# Patient Record
Sex: Female | Born: 1985 | Race: White | Hispanic: No | Marital: Single | Smoking: Former smoker
Health system: Southern US, Community
[De-identification: ages and names within clinical notes are randomized; demographics above are authoritative.]

## PROBLEM LIST (undated history)

## (undated) DIAGNOSIS — I471 Supraventricular tachycardia, unspecified: Secondary | ICD-10-CM

## (undated) DIAGNOSIS — F53 Postpartum depression: Secondary | ICD-10-CM

## (undated) DIAGNOSIS — O99345 Other mental disorders complicating the puerperium: Secondary | ICD-10-CM

## (undated) DIAGNOSIS — R87629 Unspecified abnormal cytological findings in specimens from vagina: Secondary | ICD-10-CM

## (undated) DIAGNOSIS — F410 Panic disorder [episodic paroxysmal anxiety] without agoraphobia: Secondary | ICD-10-CM

## (undated) DIAGNOSIS — Z8679 Personal history of other diseases of the circulatory system: Secondary | ICD-10-CM

## (undated) HISTORY — DX: Supraventricular tachycardia, unspecified: I47.10

## (undated) HISTORY — DX: Panic disorder (episodic paroxysmal anxiety): F41.0

## (undated) HISTORY — DX: Postpartum depression: F53.0

## (undated) HISTORY — DX: Supraventricular tachycardia: I47.1

## (undated) HISTORY — PX: CARDIAC ELECTROPHYSIOLOGY STUDY AND ABLATION: SHX1294

## (undated) HISTORY — PX: WISDOM TOOTH EXTRACTION: SHX21

## (undated) HISTORY — DX: Unspecified abnormal cytological findings in specimens from vagina: R87.629

## (undated) HISTORY — DX: Other mental disorders complicating the puerperium: O99.345

## (undated) HISTORY — DX: Personal history of other diseases of the circulatory system: Z86.79

## (undated) HISTORY — PX: COLPOSCOPY: SHX161

## (undated) HISTORY — PX: INDUCED ABORTION: SHX677

---

## 2012-09-04 HISTORY — PX: LEEP: SHX91

## 2017-11-27 NOTE — Progress Notes (Signed)
Subjective:    Patient ID: Catherine Carpenter, female    DOB: [birth year], 32 y.o.   MRN: [id number]  HPI Chief Complaint  Patient presents with  . new pt    new pt cpe, no other concerns. sees obgyn. possible chance of pregnancy-   She is new to the practice and here for a complete physical exam. Previous medical care: moved here from [facility].    History of panic disorder and states she has been taking clonazepam for several years.  States she does not take it every day. Reports recently going through a divorce and moving here has caused her increased stress and she has been taking it more than usual.  History of SVT and rheumatic fever.  States she has had 2 ablations in the past. States she is not followed up with her cardiologist since 2017.   Other providers: OB/GYN -she plans to schedule with one here.  Social history: Lives with her dad. She is divorced and child is 59 years old, works for [employer]  She stopped smoking for 5 years until going through a divorce and started back last year.  Smoking 1/2 pack per day. Since age 24.  Diet: fairly healthy  Excerise: walks daily   Immunizations: up to date [facility]   Depression screen [facility] 2/9 11/28/2017  Decreased Interest 0  Down, Depressed, Hopeless 0  PHQ - 2 Score 0     Health maintenance:  Mammogram: N/A Colonoscopy: N/A Last Gynecological Exam: last pap smear 2017 and normal. History of ASCUS high risk.   Last Dental Exam: twice annually  Last Eye Exam: 1 year ago   Wears seatbelt always, uses sunscreen, smoke detectors in home and functioning, does not text while driving and feels safe in home environment.   Reviewed allergies, medications, past medical, surgical, family, and social history.    Review of Systems Review of Systems Constitutional: -fever, -chills, -sweats, -unexpected weight change,-fatigue ENT: -runny nose, -ear pain, -sore throat Cardiology:  -chest pain, -palpitations, -edema Respiratory:  -cough, -shortness of breath, -wheezing Gastroenterology: -abdominal pain, -nausea, -vomiting, -diarrhea, -constipation  Hematology: -bleeding or bruising problems Musculoskeletal: -arthralgias, -myalgias, -joint swelling, -back pain Ophthalmology: -vision changes Urology: -dysuria, -difficulty urinating, -hematuria, -urinary frequency, -urgency Neurology: -headache, -weakness, -tingling, -numbness       Objective:   Physical Exam BP 120/64   Pulse 71   Ht 5' 7.25" (1.708 m)   Wt 139 lb 6.4 oz (63.2 kg)   LMP 11/03/2017   BMI 21.67 kg/m   General Appearance:    Alert, cooperative, no distress, appears stated age  Head:    Normocephalic, without obvious abnormality, atraumatic  Eyes:    PERRL, conjunctiva/corneas clear, EOM's intact, fundi    benign  Ears:    Normal TM's and external ear canals  Nose:   Nares normal, mucosa normal, no drainage or sinus   tenderness  Throat:   Lips, mucosa, and tongue normal; teeth and gums normal  Neck:   Supple, no lymphadenopathy;  thyroid:  no   enlargement/tenderness/nodules; no carotid   bruit or JVD  Back:    Spine nontender, no curvature, ROM normal, no CVA     tenderness  Lungs:     Clear to auscultation bilaterally without wheezes, rales or     ronchi; respirations unlabored  Chest Wall:    No tenderness or deformity   Heart:    Regular rate and rhythm, S1 and S2 normal, no murmur, rub   or  gallop  Breast Exam:    OB/GYN  Abdomen:     Soft, non-tender, nondistended, normoactive bowel sounds,    no masses, no hepatosplenomegaly  Genitalia:    OB/GYN  Rectal:    Not performed due to age<40 and no related complaints  Extremities:   No clubbing, cyanosis or edema  Pulses:   2+ and symmetric all extremities  Skin:   Skin color, texture, turgor normal, no rashes or lesions  Lymph nodes:   Cervical, supraclavicular, and axillary nodes normal  Neurologic:   CNII-XII intact, normal strength, sensation and gait; reflexes 2+ and symmetric  throughout          Psych:   Normal mood, affect, hygiene and grooming.     Urinalysis dipstick: negative  UPT- negative        Assessment & Plan:  Routine general medical examination at a health care facility - Plan: POCT Urinalysis DIP (Proadvantage Device), CBC with Differential/Platelet, Comprehensive metabolic panel, TSH  Unprotected sex - Plan: POCT urine pregnancy  Paroxysmal SVT (supraventricular tachycardia) (HCC)  Panic disorder - Plan: clonazePAM (KLONOPIN) 1 MG tablet  History of rheumatic fever as a child  Family history of thyroid disease in mother - Plan: TSH  UPT negative.  Urinalysis dipstick negative. Declines STD testing. Plans to schedule with OB/GYN for Pap smear. Discussed that clonazepam is recommended for short-term treatment.  She reports trying multiple medications in the past and did not like how they made her feel.  She declines seeing a Veterinary surgeoncounselor. Discussed that I will refill clonazepam for now and if she is needing it on a daily basis then I will refer her to psychiatry for further evaluation.  I also encouraged her to think about seeing a counselor.  Discussed potential side effects from long-term benzodiazepine use. Follow up pending labs. Will consider referral to cardiology since she reports needing to follow up.

## 2017-11-28 ENCOUNTER — Encounter: Payer: Self-pay | Admitting: Family Medicine

## 2017-11-28 ENCOUNTER — Ambulatory Visit (INDEPENDENT_AMBULATORY_CARE_PROVIDER_SITE_OTHER): Payer: 59 | Admitting: Family Medicine

## 2017-11-28 VITALS — BP 120/64 | HR 71 | Ht 67.25 in | Wt 139.4 lb

## 2017-11-28 DIAGNOSIS — Z8349 Family history of other endocrine, nutritional and metabolic diseases: Secondary | ICD-10-CM | POA: Diagnosis not present

## 2017-11-28 DIAGNOSIS — Z7251 High risk heterosexual behavior: Secondary | ICD-10-CM

## 2017-11-28 DIAGNOSIS — Z8679 Personal history of other diseases of the circulatory system: Secondary | ICD-10-CM | POA: Diagnosis not present

## 2017-11-28 DIAGNOSIS — I471 Supraventricular tachycardia: Secondary | ICD-10-CM | POA: Diagnosis not present

## 2017-11-28 DIAGNOSIS — F410 Panic disorder [episodic paroxysmal anxiety] without agoraphobia: Secondary | ICD-10-CM | POA: Insufficient documentation

## 2017-11-28 DIAGNOSIS — Z0000 Encounter for general adult medical examination without abnormal findings: Secondary | ICD-10-CM | POA: Diagnosis not present

## 2017-11-28 LAB — POCT URINALYSIS DIP (PROADVANTAGE DEVICE)
Bilirubin, UA: NEGATIVE
Blood, UA: NEGATIVE
Glucose, UA: NEGATIVE mg/dL
Ketones, POC UA: NEGATIVE mg/dL
LEUKOCYTES UA: NEGATIVE
NITRITE UA: NEGATIVE
PH UA: 6 (ref 5.0–8.0)
Protein Ur, POC: NEGATIVE mg/dL
Specific Gravity, Urine: 1.03
Urobilinogen, Ur: NEGATIVE

## 2017-11-28 LAB — POCT URINE PREGNANCY: PREG TEST UR: NEGATIVE

## 2017-11-28 MED ORDER — CLONAZEPAM 1 MG PO TABS: 1.0000 mg | ORAL_TABLET | Freq: Two times a day (BID) | ORAL | 0 refills | Status: DC | PRN

## 2017-11-28 MED FILL — clonazePAM 1 MG TABS: 1 | 30 days supply | Qty: 60 | Fill #0

## 2017-11-28 NOTE — Patient Instructions (Addendum)

## 2017-11-29 LAB — COMPREHENSIVE METABOLIC PANEL
A/G RATIO: 1.7 (ref 1.2–2.2)
ALT: 13 IU/L (ref 0–32)
AST: 14 IU/L (ref 0–40)
Albumin: 4.7 g/dL (ref 3.5–5.5)
Alkaline Phosphatase: 55 IU/L (ref 39–117)
BUN/Creatinine Ratio: 14 (ref 9–23)
BUN: 10 mg/dL (ref 6–20)
Bilirubin Total: <0.2 mg/dL (ref 0.0–1.2)
CALCIUM: 9.8 mg/dL (ref 8.7–10.2)
CO2: 24 mmol/L (ref 20–29)
Chloride: 100 mmol/L (ref 96–106)
Creatinine, Ser: 0.7 mg/dL (ref 0.57–1.00)
GFR calc Af Amer: 135 mL/min/{1.73_m2} (ref >59)
GFR, EST NON AFRICAN AMERICAN: 117 mL/min/{1.73_m2} (ref >59)
Globulin, Total: 2.8 g/dL (ref 1.5–4.5)
Glucose: 82 mg/dL (ref 65–99)
POTASSIUM: 4.3 mmol/L (ref 3.5–5.2)
Sodium: 138 mmol/L (ref 134–144)
Total Protein: 7.5 g/dL (ref 6.0–8.5)

## 2017-11-29 LAB — CBC WITH DIFFERENTIAL/PLATELET
BASOS ABS: 0 10*3/uL (ref 0.0–0.2)
Basos: 0 %
EOS (ABSOLUTE): 0.1 10*3/uL (ref 0.0–0.4)
Eos: 1 %
Hematocrit: 40.4 % (ref 34.0–46.6)
Hemoglobin: 13.2 g/dL (ref 11.1–15.9)
IMMATURE GRANULOCYTES: 0 %
Immature Grans (Abs): 0 10*3/uL (ref 0.0–0.1)
Lymphocytes Absolute: 3.8 10*3/uL — ABNORMAL HIGH (ref 0.7–3.1)
Lymphs: 39 %
MCH: 31.8 pg (ref 26.6–33.0)
MCHC: 32.7 g/dL (ref 31.5–35.7)
MCV: 97 fL (ref 79–97)
MONOS ABS: 0.6 10*3/uL (ref 0.1–0.9)
Monocytes: 7 %
NEUTROS PCT: 53 %
Neutrophils Absolute: 5 10*3/uL (ref 1.4–7.0)
PLATELETS: 301 10*3/uL (ref 150–379)
RBC: 4.15 x10E6/uL (ref 3.77–5.28)
RDW: 12.6 % (ref 12.3–15.4)
WBC: 9.6 10*3/uL (ref 3.4–10.8)

## 2017-11-29 LAB — TSH: TSH: 0.775 u[IU]/mL (ref 0.450–4.500)

## 2017-12-19 ENCOUNTER — Telehealth: Payer: 59 | Admitting: Family

## 2017-12-19 ENCOUNTER — Telehealth: Payer: Self-pay

## 2017-12-19 DIAGNOSIS — B3731 Acute candidiasis of vulva and vagina: Secondary | ICD-10-CM

## 2017-12-19 DIAGNOSIS — B373 Candidiasis of vulva and vagina: Secondary | ICD-10-CM

## 2017-12-19 MED ORDER — FLUCONAZOLE 150 MG PO TABS: 150.0000 mg | ORAL_TABLET | Freq: Once | ORAL | 0 refills | Status: AC

## 2017-12-19 MED FILL — FLUCONAZOLE 150 MG TABLET: 150 | 1 days supply | Qty: 1 | Fill #0

## 2017-12-19 NOTE — Telephone Encounter (Signed)
  Patient called back again to check on rx States she was told would be handled before 11:30 Advised her that is not normally possible and that lunch is usually the earliest providers are able to look at their messages.  She states that she is leaving to go oout of town at [facility] tomorrow morning

## 2017-12-19 NOTE — Telephone Encounter (Signed)
She would need to be seen to determine appropriate treatment.

## 2017-12-19 NOTE — Progress Notes (Signed)

## 2017-12-19 NOTE — Telephone Encounter (Signed)
Patient called stating that she has either a yeast infection or bacterial infection and will need an antibiotic sent to the pharmacy. Stated she can't come in for an appointment today and she's going out of town tomorrow. Please advise patient.   She wants to have the med called in before 11:30 if possible.    Symptoms: Thick white discharge with odor and minor itch.   Patient asked to leave a message if she does not answer letting her know her script has been sent.

## 2017-12-19 NOTE — Telephone Encounter (Signed)
Patient was contacted and notified. Patient states she will contact a [facility] doctor via mychart in hopes to get medication.

## 2018-01-01 ENCOUNTER — Telehealth: Payer: 59 | Admitting: Family

## 2018-01-01 ENCOUNTER — Telehealth (INDEPENDENT_AMBULATORY_CARE_PROVIDER_SITE_OTHER): Payer: Self-pay

## 2018-01-01 DIAGNOSIS — N760 Acute vaginitis: Secondary | ICD-10-CM | POA: Diagnosis not present

## 2018-01-01 DIAGNOSIS — B9689 Other specified bacterial agents as the cause of diseases classified elsewhere: Secondary | ICD-10-CM | POA: Diagnosis not present

## 2018-01-01 MED ORDER — METRONIDAZOLE 500 MG PO TABS: 500.0000 mg | ORAL_TABLET | Freq: Two times a day (BID) | ORAL | 0 refills | Status: DC

## 2018-01-01 MED ORDER — FLUCONAZOLE 150 MG PO TABS: 150.0000 mg | ORAL_TABLET | Freq: Once | ORAL | 0 refills | Status: AC

## 2018-01-01 MED FILL — FLUCONAZOLE 150 MG TABS: 150 | 1 days supply | Qty: 1 | Fill #0

## 2018-01-01 MED FILL — metroNIDAZOLE 500 MG TABS: 500 | 7 days supply | Qty: 14 | Fill #0

## 2018-01-01 NOTE — Progress Notes (Signed)

## 2018-01-04 ENCOUNTER — Other Ambulatory Visit: Payer: Self-pay | Admitting: Family Medicine

## 2018-01-04 DIAGNOSIS — F410 Panic disorder [episodic paroxysmal anxiety] without agoraphobia: Secondary | ICD-10-CM

## 2018-01-04 MED FILL — clonazePAM 1 MG TABS: 1 | 30 days supply | Qty: 60 | Fill #0

## 2018-01-04 NOTE — Telephone Encounter (Signed)
Sent My [facility] message to pt about info that you gave me . KH 01-04-18

## 2018-01-04 NOTE — Telephone Encounter (Signed)
This was taken care of by Vernona Rieger earlier today. I approved it. Have her check the pharmacy.

## 2018-01-04 NOTE — Telephone Encounter (Signed)
Please advise if Klonopin can be  filled at [facility].

## 2018-01-04 NOTE — Telephone Encounter (Signed)
Please give her my list of psychiatrists and she will want to call and check to make sure they take her insurance.

## 2018-01-04 NOTE — Telephone Encounter (Signed)
Called in Clonazepam per Vickie & called pt informed.  Also advised pt per Vickie that if she is going to need this on a regular basis that she would need to be seen by psychiatrist.   Pt would like to know who you recommend that is in the [facility] network?

## 2018-01-04 NOTE — Telephone Encounter (Signed)
Pt wants a call back when RX has been approved.  Pharmacy closes at 6 and she is coming from [place].

## 2018-02-01 DIAGNOSIS — F419 Anxiety disorder, unspecified: Secondary | ICD-10-CM | POA: Diagnosis not present

## 2018-02-27 ENCOUNTER — Encounter: Payer: Self-pay | Admitting: Family Medicine

## 2018-02-27 ENCOUNTER — Other Ambulatory Visit: Payer: Self-pay | Admitting: Family Medicine

## 2018-02-27 DIAGNOSIS — F410 Panic disorder [episodic paroxysmal anxiety] without agoraphobia: Secondary | ICD-10-CM

## 2018-02-27 MED ORDER — CLONAZEPAM 1 MG PO TABS: ORAL_TABLET | ORAL | 0 refills | Status: DC

## 2018-02-27 MED FILL — clonazePAM 1 MG TABS: 1 | 30 days supply | Qty: 60 | Fill #0

## 2018-03-08 DIAGNOSIS — I00 Rheumatic fever without heart involvement: Secondary | ICD-10-CM | POA: Insufficient documentation

## 2018-03-08 DIAGNOSIS — F410 Panic disorder [episodic paroxysmal anxiety] without agoraphobia: Secondary | ICD-10-CM | POA: Diagnosis not present

## 2018-03-08 DIAGNOSIS — Z8679 Personal history of other diseases of the circulatory system: Secondary | ICD-10-CM | POA: Diagnosis not present

## 2018-03-08 DIAGNOSIS — I471 Supraventricular tachycardia: Secondary | ICD-10-CM | POA: Diagnosis not present

## 2018-05-24 ENCOUNTER — Encounter: Payer: Self-pay | Admitting: Family Medicine

## 2018-06-05 ENCOUNTER — Encounter (HOSPITAL_COMMUNITY): Payer: Self-pay

## 2018-06-05 ENCOUNTER — Ambulatory Visit (INDEPENDENT_AMBULATORY_CARE_PROVIDER_SITE_OTHER): Payer: 59

## 2018-06-05 ENCOUNTER — Ambulatory Visit (HOSPITAL_COMMUNITY)
Admission: EM | Admit: 2018-06-05 | Discharge: 2018-06-05 | Disposition: A | Discharge status: Home / Self Care | Payer: 59 | Attending: Family Medicine | Admitting: Family Medicine

## 2018-06-05 DIAGNOSIS — M25572 Pain in left ankle and joints of left foot: Secondary | ICD-10-CM

## 2018-06-05 DIAGNOSIS — M79672 Pain in left foot: Secondary | ICD-10-CM

## 2018-06-05 DIAGNOSIS — S99912A Unspecified injury of left ankle, initial encounter: Secondary | ICD-10-CM | POA: Diagnosis not present

## 2018-06-05 DIAGNOSIS — S93402A Sprain of unspecified ligament of left ankle, initial encounter: Secondary | ICD-10-CM | POA: Diagnosis not present

## 2018-06-05 DIAGNOSIS — M7989 Other specified soft tissue disorders: Secondary | ICD-10-CM | POA: Diagnosis not present

## 2018-06-05 DIAGNOSIS — S93602A Unspecified sprain of left foot, initial encounter: Secondary | ICD-10-CM | POA: Diagnosis not present

## 2018-06-05 MED ORDER — TRAMADOL HCL 50 MG PO TABS: 50.0000 mg | ORAL_TABLET | Freq: Four times a day (QID) | ORAL | 0 refills | Status: DC | PRN

## 2018-06-05 MED FILL — traMADol HCL 50 MG TABS: 50 | 4 days supply | Qty: 15 | Fill #0

## 2018-06-05 NOTE — ED Notes (Signed)
Bed: UC01 Expected date:  Expected time:  Means of arrival:  Comments: Appointments 

## 2018-06-05 NOTE — ED Provider Notes (Signed)
[facility]   [id number] 06/05/18 Arrival Time: 1552  ASSESSMENT & PLAN:  1. Acute left ankle pain   2. Foot pain, left   3. Sprain of left ankle, unspecified ligament, initial encounter     Imaging: Dg Ankle Complete Left  Result Date: 06/05/2018 CLINICAL DATA:  Injury to ankle 1 week ago.  Unable bear weight. EXAM: LEFT ANKLE COMPLETE - 3+ VIEW COMPARISON:  None. FINDINGS: Soft tissue swelling is present anterior and lateral to the left ankle. There is no underlying fracture. The ankle joint is located. IMPRESSION: 1. Soft tissue swelling anterior and lateral to the left ankle without underlying fractures. Electronically Signed   By: Marin Roberts M.D.   On: 06/05/2018 17:26   Dg Foot Complete Left  Result Date: 06/05/2018 CLINICAL DATA:  Twisted foot while running to car. EXAM: LEFT FOOT - COMPLETE 3+ VIEW COMPARISON:  None. FINDINGS: There is no evidence of fracture or dislocation. There is no evidence of arthropathy or other focal bone abnormality. Soft tissues are unremarkable. IMPRESSION: Negative. Electronically Signed   By: Awilda Metro M.D.   On: 06/05/2018 17:17   Meds ordered this encounter  Medications  . traMADol (ULTRAM) 50 MG tablet    Sig: Take 1 tablet (50 mg total) by mouth every 6 (six) hours as needed.    Dispense:  15 tablet    Refill:  0    Follow-up Information    Henson, Vickie L, NP-C.   Specialty:  Family Medicine Why:  As needed. Contact information: [address] [address] [phone]          She has ASO to use. WBAT. Declines crutches. OTC ibuprofen with food. Medication sedation precautions given.  Reviewed expectations re: course of current medical issues. Questions answered. Outlined signs and symptoms indicating need for more acute intervention. Patient verbalized understanding. After Visit Summary given.  SUBJECTIVE: History from: patient. Catherine Carpenter is a 32 y.o. female who reports  persistent mild to moderate pain of her left ankle and foot that has gradually worsened since beginning; described as aching without radiation. Onset: abrupt, 4-5 days ago. Injury/trama: yes, reports fall with immediate discomfort; able to bear weight but with discomfort. Relieved by: rest. Worsened by: weight bearing and certain movements. Associated symptoms: none reported. Extremity sensation changes or weakness: none. Self treatment: tried OTCs without relief of pain. History of similar: no  ROS: As per HPI.   OBJECTIVE:  Vitals:   06/05/18 1634  BP: 113/80  Pulse: 78  Resp: 20  Temp: 98.8 F (37.1 C)  TempSrc: Oral  SpO2: 100%    General appearance: alert; no distress Extremities: warm and well perfused; symmetrical with no gross deformities; localized tenderness over her left lateral ankle and foot, lesser so, with mild swelling and mild bruising near toes; ROM around area or areas of discomfort: normal but with discomfort CV: brisk extremity capillary refill Skin: warm and dry Neurologic: normal gait; normal symmetric reflexes in all extremities; normal sensation in all extremities Psychological: alert and cooperative; normal mood and affect  Allergies  Allergen Reactions  . Penicillins Hives    Past Medical History:  Diagnosis Date  . History of rheumatic fever as a child   . Panic disorder   . Paroxysmal SVT (supraventricular tachycardia) (HCC)    Social History   Socioeconomic History  . Marital status: Single    Spouse name: Not on file  . Number of children: Not on file  . Years of  education: Not on file  . Highest education level: Not on file  Occupational History  . Not on file  Social Needs  . Financial resource strain: Not on file  . Food insecurity:    Worry: Not on file    Inability: Not on file  . Transportation needs:    Medical: Not on file    Non-medical: Not on file  Tobacco Use  . Smoking status: Current Every Day Smoker     Packs/day: 0.50  . Smokeless tobacco: Never Used  Substance and Sexual Activity  . Alcohol use: Yes    Comment: socially  . Drug use: Never  . Sexual activity: Yes    Partners: Male    Birth control/protection: None  Lifestyle  . Physical activity:    Days per week: Not on file    Minutes per session: Not on file  . Stress: Not on file  Relationships  . Social connections:    Talks on phone: Not on file    Gets together: Not on file    Attends religious service: Not on file    Active member of club or organization: Not on file    Attends meetings of clubs or organizations: Not on file    Relationship status: Not on file  Other Topics Concern  . Not on file  Social History Narrative  . Not on file   Family History  Problem Relation Age of Onset  . Diabetes Mother   . Thyroid disease Mother   . Depression Mother   . Depression Father   . Hypertension Father   . Hyperlipidemia Father    Past Surgical History:  Procedure Laterality Date  . CARDIAC ELECTROPHYSIOLOGY STUDY AND ABLATION    . INDUCED ABORTION    . LEEP  2014      Mardella Layman, MD 06/19/18 [phone]

## 2018-06-05 NOTE — ED Triage Notes (Signed)
Pt presents with left ankle and foot pain following a fall Thursday night

## 2018-06-05 NOTE — Discharge Instructions (Addendum)
You have been diagnosed with an ankle sprain today. Please wear the ankle brace provided for the next week. If crutches were provided, please use them to remain non weight bearing for the next 2 days. After this, you may gradually begin to bear weight as tolerated. If possible, elevate your ankle when seated. Applying ice for 20 minutes at a time every hour as needed may help with any pain for swelling you may have. You may also  take Ibuprofen 3 times daily for pain and inflammation. Follow up with your doctor or an orthopaedist in 1 week if you are not seeing significant improvement. °

## 2018-06-17 MED FILL — clonazePAM 1 MG TABS: 1 | 30 days supply | Qty: 60 | Fill #0

## 2018-06-28 DIAGNOSIS — F410 Panic disorder [episodic paroxysmal anxiety] without agoraphobia: Secondary | ICD-10-CM | POA: Diagnosis not present

## 2018-06-28 DIAGNOSIS — Z01419 Encounter for gynecological examination (general) (routine) without abnormal findings: Secondary | ICD-10-CM | POA: Diagnosis not present

## 2018-07-03 LAB — HM PAP SMEAR: HM Pap smear: NEGATIVE

## 2018-07-10 ENCOUNTER — Other Ambulatory Visit: Payer: 59 | Admitting: Adult Health

## 2018-07-12 ENCOUNTER — Telehealth: Payer: 59 | Admitting: Family

## 2018-07-12 DIAGNOSIS — N390 Urinary tract infection, site not specified: Secondary | ICD-10-CM

## 2018-07-12 MED ORDER — NITROFURANTOIN MONOHYD MACRO 100 MG PO CAPS: 100.0000 mg | ORAL_CAPSULE | Freq: Two times a day (BID) | ORAL | 0 refills | Status: DC

## 2018-07-12 NOTE — Progress Notes (Signed)

## 2018-07-16 ENCOUNTER — Telehealth: Payer: 59 | Admitting: Nurse Practitioner

## 2018-07-16 ENCOUNTER — Encounter: Payer: Self-pay | Admitting: Family Medicine

## 2018-07-16 DIAGNOSIS — B373 Candidiasis of vulva and vagina: Secondary | ICD-10-CM | POA: Diagnosis not present

## 2018-07-16 DIAGNOSIS — B3731 Acute candidiasis of vulva and vagina: Secondary | ICD-10-CM

## 2018-07-16 MED ORDER — FLUCONAZOLE 150 MG PO TABS: 150.0000 mg | ORAL_TABLET | Freq: Once | ORAL | 0 refills | Status: AC

## 2018-07-16 MED FILL — clonazePAM 1 MG TABS: 1 | 30 days supply | Qty: 60 | Fill #0

## 2018-07-16 NOTE — Progress Notes (Signed)

## 2018-07-17 MED FILL — FLUCONAZOLE 150 MG TABS: 150 | 1 days supply | Qty: 1 | Fill #0

## 2018-08-06 ENCOUNTER — Telehealth: Payer: Self-pay | Admitting: Family Medicine

## 2018-08-06 NOTE — Telephone Encounter (Signed)
Request[facility]. Sending back for review.

## 2018-08-08 ENCOUNTER — Telehealth: Payer: Self-pay | Admitting: Family Medicine

## 2018-08-08 NOTE — Telephone Encounter (Signed)
Dismissal letter in guarantor snapshot  °

## 2018-08-13 ENCOUNTER — Encounter: Payer: Self-pay | Admitting: Internal Medicine

## 2018-08-13 MED FILL — clonazePAM 1 MG TABS: 1 | 30 days supply | Qty: 60 | Fill #1

## 2018-09-04 NOTE — L&D Delivery Note (Signed)
Delivery Note Pt had PROM at noon on 9/16 without spont onset of labor. She rec'd a single dose of cytotec followed by Pitocin and then progressed to delivery within 2 hrs. She became complete at 2335, pushed once, and at 11:42 PM a viable female was delivered via Vaginal, Spontaneous (Presentation: ROA).  APGAR: 6, 8; weight 2815gm (6lb 3.3oz).  Infant dried and placed on pt's abd; cord clamped and cut by FOB; hospital cord blood sample collected. Placenta status: spont ,intact.  Cord: 3 vessel cord  Anesthesia:  None Episiotomy: None Lacerations: None Est. Blood Loss (mL): 100  Mom to postpartum.  Baby to Couplet care / Skin to Skin.  Myrtis Ser CNM 05/22/2019, 12:01 AM  Please schedule this patient for Postpartum visit in: 4 weeks with the following provider: Any provider For C/S patients schedule nurse incision check in weeks 2 weeks: no Low risk pregnancy complicated by: none Delivery mode:  SVD Anticipated Birth Control:  none PP Procedures needed: none  Schedule Integrated BH visit: please offer

## 2018-09-06 ENCOUNTER — Telehealth: Payer: 59 | Admitting: Family

## 2018-09-06 DIAGNOSIS — B373 Candidiasis of vulva and vagina: Secondary | ICD-10-CM | POA: Diagnosis not present

## 2018-09-06 DIAGNOSIS — B3731 Acute candidiasis of vulva and vagina: Secondary | ICD-10-CM

## 2018-09-06 MED ORDER — FLUCONAZOLE 150 MG PO TABS: 150.0000 mg | ORAL_TABLET | Freq: Once | ORAL | 0 refills | Status: AC

## 2018-09-06 MED FILL — FLUCONAZOLE 150 MG TABS: 150 | 1 days supply | Qty: 1 | Fill #0

## 2018-09-06 NOTE — Progress Notes (Signed)

## 2018-09-12 MED FILL — clonazePAM 1 MG TABS: 1 | 30 days supply | Qty: 60 | Fill #2

## 2018-09-25 ENCOUNTER — Ambulatory Visit (INDEPENDENT_AMBULATORY_CARE_PROVIDER_SITE_OTHER): Payer: 59 | Admitting: General Practice

## 2018-09-25 ENCOUNTER — Encounter: Payer: Self-pay | Admitting: Family Medicine

## 2018-09-25 DIAGNOSIS — Z3201 Encounter for pregnancy test, result positive: Secondary | ICD-10-CM

## 2018-09-25 LAB — POCT PREGNANCY, URINE: PREG TEST UR: POSITIVE — AB

## 2018-09-25 NOTE — Progress Notes (Signed)
I have reviewed the chart and agree with nursing staff's documentation of this patient's encounter.  Jaynie CollinsUgonna Madyn Ivins, MD 09/25/2018 5:05 PM

## 2018-09-25 NOTE — Progress Notes (Signed)
Patient presents to office today for upt. UPT +. Patient reports first positive home test on 1/17. LMP 08/24/18 EDD 05/31/19 [redacted]w[redacted]d. Patient reports taking clonazepam & PNV. Discussed with Dr Macon Large, medication is okay to continue and informed patient. Advised she begin prenatal care around 10-11 weeks. Patient verbalized understanding.  Chase Caller RN BSN 09/25/18

## 2018-09-27 ENCOUNTER — Ambulatory Visit: Payer: Self-pay

## 2018-10-02 ENCOUNTER — Ambulatory Visit (HOSPITAL_COMMUNITY): Payer: Self-pay | Admitting: Psychiatry

## 2018-10-04 DIAGNOSIS — F410 Panic disorder [episodic paroxysmal anxiety] without agoraphobia: Secondary | ICD-10-CM | POA: Diagnosis not present

## 2018-10-04 DIAGNOSIS — Z3480 Encounter for supervision of other normal pregnancy, unspecified trimester: Secondary | ICD-10-CM | POA: Diagnosis not present

## 2018-10-04 DIAGNOSIS — I472 Ventricular tachycardia: Secondary | ICD-10-CM | POA: Diagnosis not present

## 2018-10-07 ENCOUNTER — Other Ambulatory Visit: Payer: Self-pay | Admitting: Family Medicine

## 2018-10-07 DIAGNOSIS — F410 Panic disorder [episodic paroxysmal anxiety] without agoraphobia: Secondary | ICD-10-CM

## 2018-10-08 MED FILL — clonazePAM 1 MG TABS: 1 | 30 days supply | Qty: 60 | Fill #0 | Status: TO

## 2018-10-22 ENCOUNTER — Telehealth: Payer: Self-pay | Admitting: Family Medicine

## 2018-10-22 NOTE — Telephone Encounter (Signed)
Patient called to say she was told she could get an ultrasound on this day, as well as a panorama testing done. Please call and advise patient this can be done or not.

## 2018-11-08 ENCOUNTER — Ambulatory Visit (INDEPENDENT_AMBULATORY_CARE_PROVIDER_SITE_OTHER): Payer: 59 | Admitting: *Deleted

## 2018-11-08 ENCOUNTER — Other Ambulatory Visit: Payer: Self-pay

## 2018-11-08 ENCOUNTER — Encounter: Payer: Self-pay | Admitting: *Deleted

## 2018-11-08 VITALS — BP 102/53 | HR 77 | Wt 140.2 lb

## 2018-11-08 DIAGNOSIS — O09219 Supervision of pregnancy with history of pre-term labor, unspecified trimester: Secondary | ICD-10-CM

## 2018-11-08 DIAGNOSIS — Z113 Encounter for screening for infections with a predominantly sexual mode of transmission: Secondary | ICD-10-CM

## 2018-11-08 DIAGNOSIS — Z8742 Personal history of other diseases of the female genital tract: Secondary | ICD-10-CM | POA: Insufficient documentation

## 2018-11-08 DIAGNOSIS — O0990 Supervision of high risk pregnancy, unspecified, unspecified trimester: Secondary | ICD-10-CM | POA: Diagnosis not present

## 2018-11-08 LAB — POCT URINALYSIS DIP (DEVICE)
Bilirubin Urine: NEGATIVE
Glucose, UA: NEGATIVE mg/dL
Hgb urine dipstick: NEGATIVE
KETONES UR: NEGATIVE mg/dL
Leukocytes,Ua: NEGATIVE
Nitrite: NEGATIVE
Protein, ur: NEGATIVE mg/dL
SPECIFIC GRAVITY, URINE: 1.025 (ref 1.005–1.030)
Urobilinogen, UA: 0.2 mg/dL (ref 0.0–1.0)
pH: 5.5 (ref 5.0–8.0)

## 2018-11-08 MED FILL — clonazePAM 1 MG TABS: 1 | 30 days supply | Qty: 60 | Fill #1 | Status: TO

## 2018-11-08 NOTE — Progress Notes (Signed)
New Ob intake completed and pregnancy information packet given. Labs drawn including Panorama and Horizon per pt request. Last Pap 07/03/18 - Normal. Initial prenatal visit scheduled 11/22/18.

## 2018-11-09 NOTE — Progress Notes (Signed)
Patient seen and assessed by nursing staff.  Agree with documentation and plan.  

## 2018-11-10 LAB — URINE CULTURE, OB REFLEX

## 2018-11-10 LAB — CULTURE, OB URINE

## 2018-11-11 ENCOUNTER — Other Ambulatory Visit: Payer: Self-pay | Admitting: Family Medicine

## 2018-11-11 DIAGNOSIS — O0990 Supervision of high risk pregnancy, unspecified, unspecified trimester: Secondary | ICD-10-CM

## 2018-11-11 LAB — HEMOGLOBINOPATHY EVALUATION
FERRITIN: 94 ng/mL (ref 15–150)
HGB A2 QUANT: 2.8 % (ref 1.8–3.2)
HGB F QUANT: 0 % (ref 0.0–2.0)
Hgb A: 97.2 % (ref 96.4–98.8)
Hgb C: 0 %
Hgb S: 0 %
Hgb Solubility: NEGATIVE
Hgb Variant: 0 %

## 2018-11-11 LAB — OBSTETRIC PANEL, INCLUDING HIV
Antibody Screen: NEGATIVE
Basophils Absolute: 0 10*3/uL (ref 0.0–0.2)
Basos: 0 %
EOS (ABSOLUTE): 0.1 10*3/uL (ref 0.0–0.4)
Eos: 1 %
HEP B S AG: NEGATIVE
HIV Screen 4th Generation wRfx: NONREACTIVE
Hematocrit: 35.3 % (ref 34.0–46.6)
Hemoglobin: 12 g/dL (ref 11.1–15.9)
Immature Grans (Abs): 0 10*3/uL (ref 0.0–0.1)
Immature Granulocytes: 0 %
Lymphocytes Absolute: 2.2 10*3/uL (ref 0.7–3.1)
Lymphs: 23 %
MCH: 31.7 pg (ref 26.6–33.0)
MCHC: 34 g/dL (ref 31.5–35.7)
MCV: 93 fL (ref 79–97)
Monocytes Absolute: 0.7 10*3/uL (ref 0.1–0.9)
Monocytes: 7 %
NEUTROS PCT: 69 %
Neutrophils Absolute: 6.5 10*3/uL (ref 1.4–7.0)
Platelets: 294 10*3/uL (ref 150–450)
RBC: 3.78 x10E6/uL (ref 3.77–5.28)
RDW: 11.5 % — ABNORMAL LOW (ref 11.7–15.4)
RPR: NONREACTIVE
Rh Factor: POSITIVE
Rubella Antibodies, IGG: 5.45 index (ref >0.99)
WBC: 9.6 10*3/uL (ref 3.4–10.8)

## 2018-11-11 LAB — GC/CHLAMYDIA PROBE AMP (~~LOC~~) NOT AT ARMC
Chlamydia: NEGATIVE
Neisseria Gonorrhea: NEGATIVE

## 2018-11-19 ENCOUNTER — Encounter: Payer: Self-pay | Admitting: *Deleted

## 2018-11-21 ENCOUNTER — Encounter: Payer: Self-pay | Admitting: Obstetrics and Gynecology

## 2018-11-22 ENCOUNTER — Other Ambulatory Visit: Payer: Self-pay

## 2018-11-22 ENCOUNTER — Encounter: Payer: Self-pay | Admitting: Family Medicine

## 2018-11-22 ENCOUNTER — Ambulatory Visit (INDEPENDENT_AMBULATORY_CARE_PROVIDER_SITE_OTHER): Payer: 59 | Admitting: Family Medicine

## 2018-11-22 VITALS — BP 102/54 | HR 91 | Temp 98.6°F | Wt 141.0 lb

## 2018-11-22 DIAGNOSIS — O26891 Other specified pregnancy related conditions, first trimester: Secondary | ICD-10-CM | POA: Diagnosis not present

## 2018-11-22 DIAGNOSIS — O0990 Supervision of high risk pregnancy, unspecified, unspecified trimester: Secondary | ICD-10-CM

## 2018-11-22 DIAGNOSIS — F410 Panic disorder [episodic paroxysmal anxiety] without agoraphobia: Secondary | ICD-10-CM | POA: Diagnosis not present

## 2018-11-22 DIAGNOSIS — Z3A12 12 weeks gestation of pregnancy: Secondary | ICD-10-CM

## 2018-11-22 DIAGNOSIS — Z9889 Other specified postprocedural states: Secondary | ICD-10-CM | POA: Insufficient documentation

## 2018-11-22 DIAGNOSIS — O09219 Supervision of pregnancy with history of pre-term labor, unspecified trimester: Secondary | ICD-10-CM

## 2018-11-22 DIAGNOSIS — I471 Supraventricular tachycardia: Secondary | ICD-10-CM | POA: Diagnosis not present

## 2018-11-22 DIAGNOSIS — O09211 Supervision of pregnancy with history of pre-term labor, first trimester: Secondary | ICD-10-CM | POA: Diagnosis not present

## 2018-11-22 NOTE — BH Specialist Note (Deleted)
Integrated Behavioral Health Initial Visit  MRN: [id number] Name: Kendry Puthoff  Number of Integrated Behavioral Health Clinician visits:: {IBH Number of Visits:21014052} Session Start time: ***  Session End time: *** Total time: {IBH Total Time:21014050}  Type of Service: Integrated Behavioral Health- Individual/Family Interpretor:{yes [id number]} Interpretor Name and Language: ***   Warm Hand Off Completed.       SUBJECTIVE: Jailee Galindez is a 33 y.o. female accompanied by {CHL AMB ACCOMPANIED QV:5001642903} Patient was referred by *** for ***. Patient reports the following symptoms/concerns: *** Duration of problem: ***; Severity of problem: {Mild/Moderate/Severe:20260}  OBJECTIVE: Mood: {BHH MOOD:22306} and Affect: {BHH AFFECT:22307} Risk of harm to self or others: {CHL AMB BH Suicide Current Mental Status:21022748}  LIFE CONTEXT: Family and Social: *** School/Work: *** Self-Care: *** Life Changes: ***  GOALS ADDRESSED: Patient will: 1. Reduce symptoms of: {IBH Symptoms:21014056} 2. Increase knowledge and/or ability of: {IBH Patient Tools:21014057}  3. Demonstrate ability to: {IBH Goals:21014053}  INTERVENTIONS: Interventions utilized: {IBH Interventions:21014054}  Standardized Assessments completed: {IBH Screening Tools:21014051}  ASSESSMENT: Patient currently experiencing ***.   Patient may benefit from ***.  PLAN: 1. Follow up with behavioral health clinician on : *** 2. Behavioral recommendations: *** 3. Referral(s): {IBH Referrals:21014055} 4. "From scale of 1-10, how likely are you to follow plan?": ***  Rae Lips, LCSW  Depression screen [facility] 2/9 11/08/2018 11/28/2017  Decreased Interest 3 0  Down, Depressed, Hopeless 3 0  PHQ - 2 Score 6 0  Altered sleeping 2 -  Tired, decreased energy 3 -  Change in appetite 2 -  Feeling bad or failure about yourself  3 -  Trouble concentrating 0 -  Moving slowly or fidgety/restless 0 -  PHQ-9 Score 16 -    GAD 7 : Generalized Anxiety Score 11/08/2018  Nervous, Anxious, on Edge 3  Control/stop worrying 3  Worry too much - different things 3  Trouble relaxing 3  Restless 3  Easily annoyed or irritable 3  Afraid - awful might happen 3  Total GAD 7 Score 21

## 2018-11-22 NOTE — Patient Instructions (Signed)

## 2018-11-22 NOTE — Progress Notes (Signed)
Subjective:   Catherine Carpenter is a 33 y.o. [phone] at [redacted]w[redacted]d by LMP being seen today for her first obstetrical visit.  Her obstetrical history is significant for prior preterm birth. Patient does intend to breast feed. Pregnancy history fully reviewed.  Patient reports fatigue and headache.  HISTORY: OB History  Gravida Para Term Preterm AB Living  3 1 0 1 1 1   SAB TAB Ectopic Multiple Live Births  0 1 0 0 1    # Outcome Date GA Lbr Len/2nd Weight Sex Delivery Anes PTL Lv  3 Current           2 Preterm 05/13/05 [redacted]w[redacted]d  5 lb 5 oz (2.41 kg) F Vag-Spont None Y LIV     Name: Catherine Carpenter  1 TAB            Last pap smear was  06/2018 and was normal Past Medical History:  Diagnosis Date  . History of rheumatic fever as a child   . Panic disorder   . Paroxysmal SVT (supraventricular tachycardia) (HCC)   . Preterm labor   . Vaginal Pap smear, abnormal    LEEP 2014   Past Surgical History:  Procedure Laterality Date  . CARDIAC ELECTROPHYSIOLOGY STUDY AND ABLATION    . INDUCED ABORTION    . LEEP  2014  . WISDOM TOOTH EXTRACTION     Family History  Problem Relation Age of Onset  . Thyroid disease Mother   . Depression Mother   . Diabetes Mother        Hypoglycemia  . Depression Father   . Hypertension Father   . Hyperlipidemia Father   . Depression Sister   . Anxiety disorder Sister   . Epilepsy Sister   . Fibroids Sister   . Anxiety disorder Brother   . Depression Brother   . Depression Brother   . Anxiety disorder Brother   . Anxiety disorder Brother   . Depression Brother   . Diabetes Paternal Grandmother    Social History   Tobacco Use  . Smoking status: Former Smoker    Packs/day: 0.50    Types: Cigarettes    Last attempt to quit: 08/04/2018    Years since quitting: 0.3  . Smokeless tobacco: Never Used  Substance Use Topics  . Alcohol use: Not Currently    Alcohol/week: 4.0 standard drinks    Types: 4 Glasses of wine per week    Comment: socially  .  Drug use: Never   Allergies  Allergen Reactions  . Penicillins Hives   Current Outpatient Medications on File Prior to Visit  Medication Sig Dispense Refill  . clonazePAM (KLONOPIN) 1 MG tablet TAKE 1 TABLET BY MOUTH 2 TIMES DAILY AS NEEDED FOR ANXIETY. 60 tablet 0  . Prenatal Vit-Fe Fumarate-FA (MULTIVITAMIN-PRENATAL) 27-0.8 MG TABS tablet Take 1 tablet by mouth daily at 12 noon.     No current facility-administered medications on file prior to visit.      Exam   Vitals:   11/22/18 0837  BP: (!) 102/54  Pulse: 91  Temp: 98.6 F (37 C)  Weight: 141 lb (64 kg)   Fetal Heart Rate (bpm): 165  System: General: well-developed, well-nourished female in no acute distress   Skin: normal coloration and turgor, no rashes   Neurologic: oriented, normal, negative, normal mood   Extremities: normal strength, tone, and muscle mass, ROM of all joints is normal   HEENT PERRLA, extraocular movement intact and sclera clear, anicteric  Mouth/Teeth mucous membranes moist, pharynx normal without lesions and dental hygiene good   Neck supple and no masses   Cardiovascular: regular rate and rhythm   Respiratory:  no respiratory distress, normal breath sounds   Abdomen: soft, non-tender; bowel sounds normal; no masses,  no organomegaly     Assessment:   Pregnancy: [id number] Patient Active Problem List   Diagnosis Date Noted  . History of loop electrical excision procedure (LEEP) 11/22/2018  . Supervision of high risk pregnancy, antepartum 11/08/2018  . Previous preterm delivery, antepartum 11/08/2018  . History of abnormal cervical Pap smear 11/08/2018  . Rheumatic fever 03/08/2018  . Paroxysmal SVT (supraventricular tachycardia) (HCC)   . Panic disorder   . History of rheumatic fever as a child      Plan:  1. Supervision of high risk pregnancy, antepartum Lives in [place]--will try to get on OS to limit driving down here - [place] MFM OB COMP + 14 WK; Future - Babyscripts Schedule  Optimization  2. Previous preterm delivery, antepartum Declines 17 P following shared decision making used and she declines this intervention. First baby was at age 77 and she was under a lot of stress.  3. Paroxysmal SVT (supraventricular tachycardia) (HCC) S/p Ablation   4. History of loop electrical excision procedure (LEEP) 2012, normal paps now  5. Panic disorder On Klonopin, she reports she cannot stop, is trying to cut down. Risks to fetus discussed at length.   Initial labs drawn. Continue prenatal vitamins. Genetic Screening discussed, NIPS: results reviewed. Ultrasound discussed; fetal anatomic survey: ordered. Problem list reviewed and updated. The nature of [facility] with multiple MDs and other Advanced Practice Providers was explained to patient; also emphasized that residents, students are part of our team. Routine obstetric precautions reviewed. Return in 8 weeks (on 01/17/2019).

## 2018-11-25 ENCOUNTER — Encounter: Payer: Self-pay | Admitting: *Deleted

## 2019-01-01 ENCOUNTER — Telehealth: Payer: Self-pay | Admitting: Family Medicine

## 2019-01-01 NOTE — Telephone Encounter (Signed)
Attempted to call patient with her follow-up virtual ob appointment. No answer, left message with appointment time and date (5/15 @ 8:55am). Instructions on downloading the app were left. Advised to give the office a call with any questions or concerns

## 2019-01-03 ENCOUNTER — Ambulatory Visit (HOSPITAL_COMMUNITY): Payer: Self-pay

## 2019-01-04 ENCOUNTER — Telehealth: Payer: Self-pay | Admitting: Physician Assistant

## 2019-01-04 DIAGNOSIS — N898 Other specified noninflammatory disorders of vagina: Secondary | ICD-10-CM

## 2019-01-04 DIAGNOSIS — Z3490 Encounter for supervision of normal pregnancy, unspecified, unspecified trimester: Secondary | ICD-10-CM

## 2019-01-04 NOTE — Progress Notes (Signed)
For the safety of you and your child, I recommend a face to face office visit with a health care provider.  Many mothers need to take medicines during their pregnancy and while nursing.  Almost all medicines pass into the breast milk in small quantities.  Most are generally considered safe for a mother to take but some medicines must be avoided.  After reviewing your E-Visit request, I recommend that you consult your OB/GYN or pediatrician for medical advice in relation to your condition and prescription medications while pregnant or breastfeeding.    NOTE: If you entered your credit card information for this eVisit, you will not be charged. You may see a "hold" on your card for the $35 but that hold will drop off and you will not have a charge processed.  If you are having a true medical emergency please call 911.  If you need an urgent face to face visit, [facility] has four urgent care centers for your convenience.  If you need care fast and have a high deductible or no insurance consider:   [email or web address].[email or web address] to reserve your spot online an avoid wait times  [facility] [address], [address] [place], [place] [address] Modified hours of operation: Monday-Friday, 12 PM to 6 PM  Saturday & Sunday 10 AM to 4 PM  [facility] (New Address!) [address], [address] [place], [place] [address] *Just off [address], across the road from [employer]* Modified hours of operation: Monday-Friday, 12 PM to 6 PM  Closed Saturday & Sunday  [facility]'s modified hours of operation will be in effect from May 1 until May 31  The following sites will take your  insurance:  . [facility]  [phone] Get Driving Directions Find a Provider at this Location  [address] [place], [place] [address] . 10 am to 8 pm Monday-Friday . 12 pm to 8 pm Saturday-Sunday   . [facility]   [phone] Get Driving Directions Find a Provider at this Location  [address], [address] [place], [place] [address] . 8 am to 8 pm Monday-Friday . 9 am to 6 pm Saturday . 11 am to 6 pm Sunday   . [facility] [place]  [phone] Get Driving Directions  [address] [place], [place] [address] . 8 am to 8 pm Monday-Friday . 8 am to 4 pm Saturday-Sunday   Your e-visit answers were reviewed by a board certified advanced clinical practitioner to complete your personal care plan.  Thank you for using e-Visits.  

## 2019-01-07 ENCOUNTER — Ambulatory Visit (HOSPITAL_COMMUNITY)
Admission: RE | Admit: 2019-01-07 | Discharge: 2019-01-07 | Disposition: A | Discharge status: Home / Self Care | Payer: Self-pay | Source: Ambulatory Visit | Attending: Obstetrics and Gynecology | Admitting: Obstetrics and Gynecology

## 2019-01-07 ENCOUNTER — Other Ambulatory Visit: Payer: Self-pay

## 2019-01-07 DIAGNOSIS — Z363 Encounter for antenatal screening for malformations: Secondary | ICD-10-CM

## 2019-01-07 DIAGNOSIS — O3442 Maternal care for other abnormalities of cervix, second trimester: Secondary | ICD-10-CM

## 2019-01-07 DIAGNOSIS — O0990 Supervision of high risk pregnancy, unspecified, unspecified trimester: Secondary | ICD-10-CM | POA: Insufficient documentation

## 2019-01-07 DIAGNOSIS — O09212 Supervision of pregnancy with history of pre-term labor, second trimester: Secondary | ICD-10-CM

## 2019-01-07 DIAGNOSIS — O09219 Supervision of pregnancy with history of pre-term labor, unspecified trimester: Secondary | ICD-10-CM | POA: Insufficient documentation

## 2019-01-07 DIAGNOSIS — Z3A19 19 weeks gestation of pregnancy: Secondary | ICD-10-CM

## 2019-01-07 DIAGNOSIS — I471 Supraventricular tachycardia: Secondary | ICD-10-CM | POA: Insufficient documentation

## 2019-01-17 ENCOUNTER — Other Ambulatory Visit: Payer: Self-pay

## 2019-01-17 ENCOUNTER — Telehealth: Payer: Self-pay | Admitting: Obstetrics & Gynecology

## 2019-01-17 ENCOUNTER — Telehealth (INDEPENDENT_AMBULATORY_CARE_PROVIDER_SITE_OTHER): Payer: Self-pay | Admitting: Obstetrics & Gynecology

## 2019-01-17 VITALS — BP 116/74 | Wt 151.0 lb

## 2019-01-17 DIAGNOSIS — Z3A20 20 weeks gestation of pregnancy: Secondary | ICD-10-CM

## 2019-01-17 DIAGNOSIS — O0990 Supervision of high risk pregnancy, unspecified, unspecified trimester: Secondary | ICD-10-CM

## 2019-01-17 DIAGNOSIS — O09219 Supervision of pregnancy with history of pre-term labor, unspecified trimester: Secondary | ICD-10-CM

## 2019-01-17 DIAGNOSIS — O09212 Supervision of pregnancy with history of pre-term labor, second trimester: Secondary | ICD-10-CM

## 2019-01-17 NOTE — Telephone Encounter (Signed)
Attempted to call patient with her next appointments at our office ( 6/12 @ 8:55 virtual and 7/6 @8 :20 at office). No answer left detailed with appointment information and informed for her in office visit she will need to wear a mask and no visitors are allowed.

## 2019-01-17 NOTE — Progress Notes (Signed)
   TELEHEALTH VIRTUAL OBSTETRICS PRENATAL VISIT ENCOUNTER NOTE  I connected with Catherine Carpenter on 01/17/19 at  8:55 AM EDT by WebEx at home and verified that I am speaking with the correct person using two identifiers.   I discussed the limitations, risks, security and privacy concerns of performing an evaluation and management service by telephone and the availability of in person appointments. I also discussed with the patient that there may be a patient responsible charge related to this service. The patient expressed understanding and agreed to proceed. Subjective:  Catherine Carpenter is a 33 y.o. [phone] at [redacted]w[redacted]d being seen today for ongoing prenatal care.  She is currently monitored for the following issues for this low-risk pregnancy and has Paroxysmal SVT (supraventricular tachycardia) (HCC); Panic disorder; History of rheumatic fever as a child; Supervision of high risk pregnancy, antepartum; Previous preterm delivery, antepartum; Rheumatic fever; and History of loop electrical excision procedure (LEEP) on their problem list.  Patient reports no complaints.  Reports fetal movement. Contractions: Not present. Vag. Bleeding: None.  Movement: Present. Denies any contractions, bleeding or leaking of fluid.   The following portions of the patient's history were reviewed and updated as appropriate: allergies, current medications, past family history, past medical history, past social history, past surgical history and problem list.   Objective:  There were no vitals filed for this visit.  Fetal Status:     Movement: Present     General:  Alert, oriented and cooperative. Patient is in no acute distress.  Respiratory: Normal respiratory effort, no problems with respiration noted  Mental Status: Normal mood and affect. Normal behavior. Normal judgment and thought content.  Rest of physical exam deferred due to type of encounter  Assessment and Plan:  Pregnancy: [id number] at [redacted]w[redacted]d Supervision of  other normal pregnancy H/O preterm delivery- she declined 17P and prometrium  Preterm labor symptoms and general obstetric precautions including but not limited to vaginal bleeding, contractions, leaking of fluid and fetal movement were reviewed in detail with the patient. I discussed the assessment and treatment plan with the patient. The patient was provided an opportunity to ask questions and all were answered. The patient agreed with the plan and demonstrated an understanding of the instructions. The patient was advised to call back or seek an in-person office evaluation/go to MAU at [facility] for any urgent or concerning symptoms. Please refer to After Visit Summary for other counseling recommendations.   I provided 10 minutes of face-to-face via WebEx time during this encounter.  No follow-ups on file.  No future appointments.  Allie Bossier, MD Center for [employer], [facility]

## 2019-02-12 ENCOUNTER — Telehealth: Payer: Self-pay

## 2019-02-12 NOTE — Telephone Encounter (Signed)
Called the patient to confirm the upcoming appointment. Left a detailed voicemail message of how to access mychart as well as our office number. °

## 2019-02-14 ENCOUNTER — Other Ambulatory Visit: Payer: Self-pay

## 2019-02-14 ENCOUNTER — Telehealth: Payer: Self-pay | Admitting: Medical

## 2019-02-14 ENCOUNTER — Telehealth (INDEPENDENT_AMBULATORY_CARE_PROVIDER_SITE_OTHER): Payer: Self-pay | Admitting: Medical

## 2019-02-14 ENCOUNTER — Telehealth: Payer: Self-pay | Admitting: Obstetrics & Gynecology

## 2019-02-14 DIAGNOSIS — O0992 Supervision of high risk pregnancy, unspecified, second trimester: Secondary | ICD-10-CM

## 2019-02-14 DIAGNOSIS — Z3A24 24 weeks gestation of pregnancy: Secondary | ICD-10-CM

## 2019-02-14 DIAGNOSIS — O09212 Supervision of pregnancy with history of pre-term labor, second trimester: Secondary | ICD-10-CM

## 2019-02-14 DIAGNOSIS — O0990 Supervision of high risk pregnancy, unspecified, unspecified trimester: Secondary | ICD-10-CM

## 2019-02-14 DIAGNOSIS — O09219 Supervision of pregnancy with history of pre-term labor, unspecified trimester: Secondary | ICD-10-CM

## 2019-02-14 NOTE — Patient Instructions (Signed)

## 2019-02-14 NOTE — Telephone Encounter (Signed)
Attempted to contact patient with her next OB appointment. No answer, left detailed message with the appointment information ( 7/6 @ 8:20). Patient was instructed that this will be an office visit, no visitors are allowed and a face mask is required for the entire appointment. Appointment reminder mailed

## 2019-02-14 NOTE — Progress Notes (Signed)
   TELEHEALTH VIRTUAL OBSTETRICS VISIT ENCOUNTER NOTE  I connected with Catherine Carpenter on 02/14/19 at  9:15 AM EDT by telephone at home and verified that I am speaking with the correct person using two identifiers.   I discussed the limitations, risks, security and privacy concerns of performing an evaluation and management service by telephone and the availability of in person appointments. I also discussed with the patient that there may be a patient responsible charge related to this service. The patient expressed understanding and agreed to proceed.  Subjective:  Catherine Carpenter is a 33 y.o. [phone] at [redacted]w[redacted]d being followed for ongoing prenatal care.  She is currently monitored for the following issues for this high-risk pregnancy and has Paroxysmal SVT (supraventricular tachycardia) ([place]); Panic disorder; History of rheumatic fever as a child; Supervision of high risk pregnancy, antepartum; Previous preterm delivery, antepartum; Rheumatic fever; and History of loop electrical excision procedure (LEEP) on their problem list.  Patient reports nausea, occasional contractions and mildly increased pelvic pressure. Reports fetal movement. Denies any contractions, bleeding or leaking of fluid.   The following portions of the patient's history were reviewed and updated as appropriate: allergies, current medications, past family history, past medical history, past social history, past surgical history and problem list.   Objective:  Patient unable to obtain BP today since not at home General:  Alert, oriented and cooperative.   Mental Status: Normal mood and affect perceived. Normal judgment and thought content.  Rest of physical exam deferred due to type of encounter  Assessment and Plan:  Pregnancy: [id number] at [redacted]w[redacted]d 1. Supervision of high risk pregnancy, antepartum  2. Previous preterm delivery, antepartum - Occasional tightening, mostly in the AM with mild increase in pelvic pressure - Preterm  labor precautions discussed   Preterm labor symptoms and general obstetric precautions including but not limited to vaginal bleeding, contractions, leaking of fluid and fetal movement were reviewed in detail with the patient.  I discussed the assessment and treatment plan with the patient. The patient was provided an opportunity to ask questions and all were answered. The patient agreed with the plan and demonstrated an understanding of the instructions. The patient was advised to call back or seek an in-person office evaluation/go to MAU at [facility] for any urgent or concerning symptoms. Please refer to After Visit Summary for other counseling recommendations.   I provided 8 minutes of non-face-to-face time during this encounter.  Return in about 4 weeks (around 03/14/2019) for LOB, In-Person, 28 week labs (fasting).  Future Appointments  Date Time Provider [place]  03/10/2019  8:20 AM [facility] LAB [facility]  03/10/2019 10:35 AM Emily Filbert, MD [facility] WOC    Kerry Hough, PA-C Center for [employer], [facility]

## 2019-02-14 NOTE — Progress Notes (Signed)
I connected with  Catherine Carpenter on 02/14/19 at  9:15 AM EDT by telephone and verified that I am speaking with the correct person using two identifiers.   I discussed the limitations, risks, security and privacy concerns of performing an evaluation and management service by telephone and the availability of in person appointments. I also discussed with the patient that there may be a patient responsible charge related to this service. The patient expressed understanding and agreed to proceed.  Dolores Hoose, RN 02/14/2019  9:09 AM   0934::Pt disconnected from mychart call prior to seeing provider.  Attempted to contact pt regarding visit.  Pt did not pick up. Left voicemail advising pt that she was being contacted regarding her appointment and informing her that she would be contacted again in 15 minutes.

## 2019-02-27 ENCOUNTER — Telehealth: Payer: Self-pay | Admitting: Obstetrics & Gynecology

## 2019-02-27 NOTE — Telephone Encounter (Signed)
Patient called to say she would not be able to come in the office unless her appointment was on a Friday. She requested her appointment be on July 17 as early as possible.

## 2019-03-10 ENCOUNTER — Encounter: Payer: Self-pay | Admitting: Obstetrics & Gynecology

## 2019-03-10 ENCOUNTER — Other Ambulatory Visit: Payer: Self-pay

## 2019-03-19 ENCOUNTER — Other Ambulatory Visit: Payer: Self-pay | Admitting: *Deleted

## 2019-03-19 DIAGNOSIS — O09219 Supervision of pregnancy with history of pre-term labor, unspecified trimester: Secondary | ICD-10-CM

## 2019-03-19 DIAGNOSIS — O0990 Supervision of high risk pregnancy, unspecified, unspecified trimester: Secondary | ICD-10-CM

## 2019-03-20 ENCOUNTER — Telehealth: Payer: Self-pay | Admitting: Obstetrics & Gynecology

## 2019-03-20 NOTE — Telephone Encounter (Signed)
Attempted to reach patient about her appointment. Left a message for her to call the office.  °

## 2019-03-21 ENCOUNTER — Other Ambulatory Visit: Payer: Self-pay

## 2019-03-21 ENCOUNTER — Ambulatory Visit (INDEPENDENT_AMBULATORY_CARE_PROVIDER_SITE_OTHER): Payer: 59 | Admitting: Obstetrics & Gynecology

## 2019-03-21 ENCOUNTER — Other Ambulatory Visit: Payer: 59

## 2019-03-21 VITALS — BP 108/73 | HR 84 | Wt 156.0 lb

## 2019-03-21 DIAGNOSIS — O09219 Supervision of pregnancy with history of pre-term labor, unspecified trimester: Secondary | ICD-10-CM

## 2019-03-21 DIAGNOSIS — O09213 Supervision of pregnancy with history of pre-term labor, third trimester: Secondary | ICD-10-CM

## 2019-03-21 DIAGNOSIS — O0990 Supervision of high risk pregnancy, unspecified, unspecified trimester: Secondary | ICD-10-CM

## 2019-03-21 DIAGNOSIS — Z3A29 29 weeks gestation of pregnancy: Secondary | ICD-10-CM

## 2019-03-21 DIAGNOSIS — O0993 Supervision of high risk pregnancy, unspecified, third trimester: Secondary | ICD-10-CM

## 2019-03-21 DIAGNOSIS — R300 Dysuria: Secondary | ICD-10-CM

## 2019-03-21 LAB — POCT URINALYSIS DIP (DEVICE)
Bilirubin Urine: NEGATIVE
Glucose, UA: NEGATIVE mg/dL
Hgb urine dipstick: NEGATIVE
Ketones, ur: NEGATIVE mg/dL
Leukocytes,Ua: NEGATIVE
Nitrite: NEGATIVE
Protein, ur: NEGATIVE mg/dL
Specific Gravity, Urine: 1.02 (ref 1.005–1.030)
Urobilinogen, UA: 0.2 mg/dL (ref 0.0–1.0)
pH: 7 (ref 5.0–8.0)

## 2019-03-21 NOTE — Progress Notes (Signed)
   PRENATAL VISIT NOTE  Subjective:  Catherine Carpenter is a 33 y.o. [phone] at [redacted]w[redacted]d being seen today for ongoing prenatal care.  She is currently monitored for the following issues for this low-risk pregnancy and has Paroxysmal SVT (supraventricular tachycardia) ([place]); Panic disorder; History of rheumatic fever as a child; Supervision of high risk pregnancy, antepartum; Previous preterm delivery, antepartum; Rheumatic fever; and History of loop electrical excision procedure (LEEP) on their problem list.  Patient reports no complaints.  Contractions: Irritability. Vag. Bleeding: None.  Movement: Present. Denies leaking of fluid.   The following portions of the patient's history were reviewed and updated as appropriate: allergies, current medications, past family history, past medical history, past social history, past surgical history and problem list.   Objective:   Vitals:   03/21/19 0913  BP: 108/73  Pulse: 84  Weight: 156 lb (70.8 kg)    Fetal Status: Fetal Heart Rate (bpm): 135   Movement: Present     General:  Alert, oriented and cooperative. Patient is in no acute distress.  Skin: Skin is warm and dry. No rash noted.   Cardiovascular: Normal heart rate noted  Respiratory: Normal respiratory effort, no problems with respiration noted  Abdomen: Soft, gravid, appropriate for gestational age.  Pain/Pressure: Present     Pelvic: Cervical exam deferred        Extremities: Normal range of motion.  Edema: None  Mental Status: Normal mood and affect. Normal behavior. Normal judgment and thought content.   Assessment and Plan:  Pregnancy: [id number] at [redacted]w[redacted]d 1. Previous preterm delivery, antepartum - 59 yo daughter was born at about 15 weeks - she declined 27 P  2. Supervision of high risk pregnancy, antepartum   Preterm labor symptoms and general obstetric precautions including but not limited to vaginal bleeding, contractions, leaking of fluid and fetal movement were reviewed in detail  with the patient. Please refer to After Visit Summary for other counseling recommendations.   No follow-ups on file.  Future Appointments  Date Time Provider Department Center  03/21/2019  9:35 AM Emily Filbert, MD [facility] WOC    Emily Filbert, MD

## 2019-03-21 NOTE — Addendum Note (Signed)
Addended by: Emily Filbert on: 03/21/2019 09:32 AM   Modules accepted: Orders

## 2019-03-22 LAB — CBC
Hematocrit: 33.8 % — ABNORMAL LOW (ref 34.0–46.6)
Hemoglobin: 11.4 g/dL (ref 11.1–15.9)
MCH: 32.4 pg (ref 26.6–33.0)
MCHC: 33.7 g/dL (ref 31.5–35.7)
MCV: 96 fL (ref 79–97)
Platelets: 239 10*3/uL (ref 150–450)
RBC: 3.52 x10E6/uL — ABNORMAL LOW (ref 3.77–5.28)
RDW: 11.5 % — ABNORMAL LOW (ref 11.7–15.4)
WBC: 10.8 10*3/uL (ref 3.4–10.8)

## 2019-03-22 LAB — HIV ANTIBODY (ROUTINE TESTING W REFLEX): HIV Screen 4th Generation wRfx: NONREACTIVE

## 2019-03-22 LAB — RPR: RPR Ser Ql: NONREACTIVE

## 2019-03-22 LAB — GLUCOSE TOLERANCE, 2 HOURS W/ 1HR
Glucose, 1 hour: 141 mg/dL (ref 65–179)
Glucose, 2 hour: 126 mg/dL (ref 65–152)
Glucose, Fasting: 87 mg/dL (ref 65–91)

## 2019-03-23 LAB — URINE CULTURE, OB REFLEX: Organism ID, Bacteria: NO GROWTH

## 2019-03-23 LAB — CULTURE, OB URINE

## 2019-04-04 ENCOUNTER — Telehealth (INDEPENDENT_AMBULATORY_CARE_PROVIDER_SITE_OTHER): Payer: 59 | Admitting: Obstetrics & Gynecology

## 2019-04-04 ENCOUNTER — Encounter: Payer: Self-pay | Admitting: Obstetrics & Gynecology

## 2019-04-04 ENCOUNTER — Other Ambulatory Visit: Payer: Self-pay

## 2019-04-04 VITALS — BP 108/68 | Temp 97.5°F | Wt 156.0 lb

## 2019-04-04 DIAGNOSIS — Z3A31 31 weeks gestation of pregnancy: Secondary | ICD-10-CM

## 2019-04-04 DIAGNOSIS — O0993 Supervision of high risk pregnancy, unspecified, third trimester: Secondary | ICD-10-CM

## 2019-04-04 DIAGNOSIS — O09219 Supervision of pregnancy with history of pre-term labor, unspecified trimester: Secondary | ICD-10-CM

## 2019-04-04 DIAGNOSIS — R0989 Other specified symptoms and signs involving the circulatory and respiratory systems: Secondary | ICD-10-CM

## 2019-04-04 DIAGNOSIS — O26893 Other specified pregnancy related conditions, third trimester: Secondary | ICD-10-CM

## 2019-04-04 DIAGNOSIS — R319 Hematuria, unspecified: Secondary | ICD-10-CM

## 2019-04-04 DIAGNOSIS — O0990 Supervision of high risk pregnancy, unspecified, unspecified trimester: Secondary | ICD-10-CM

## 2019-04-04 DIAGNOSIS — O09213 Supervision of pregnancy with history of pre-term labor, third trimester: Secondary | ICD-10-CM

## 2019-04-04 NOTE — Patient Instructions (Signed)
Return to office for any scheduled appointments. Call the office or go to the MAU at [facility] if:  You begin to have strong, frequent contractions  Your water breaks.  Sometimes it is a big gush of fluid, sometimes it is just a trickle that keeps getting your panties wet or running down your legs  You have vaginal bleeding.  It is normal to have a small amount of spotting if your cervix was checked.   You do not feel your baby moving like normal.  If you do not, get something to eat and drink and lay down and focus on feeling your baby move.   If your baby is still not moving like normal, you should call the office or go to MAU.  Any other obstetric concerns.   

## 2019-04-04 NOTE — Progress Notes (Signed)
TELEHEALTH OBSTETRICS PRENATAL VIRTUAL VIDEO VISIT ENCOUNTER NOTE  Provider location: Center for [employer] at [facility]   I connected with Catherine Carpenter on 04/04/19 at 10:15 AM EDT by MyChart Video Encounter at home and verified that I am speaking with the correct person using two identifiers.   I discussed the limitations, risks, security and privacy concerns of performing an evaluation and management service virtually and the availability of in person appointments. I also discussed with the patient that there may be a patient responsible charge related to this service. The patient expressed understanding and agreed to proceed. Subjective:  Catherine Carpenter is a 33 y.o. [phone] at [redacted]w[redacted]d being seen today for ongoing prenatal care.  She is currently monitored for the following issues for this high-risk pregnancy and has Paroxysmal SVT (supraventricular tachycardia) ([place]); Panic disorder; History of rheumatic fever as a child; Supervision of high risk pregnancy, antepartum; Previous preterm delivery, antepartum; Rheumatic fever; and History of loop electrical excision procedure (LEEP) on their problem list.  Patient reports having respiratory symptoms.  Has nasal congestion, no cough, but has malaise and body aches.  No fevers. Feels it could be allergies but symptoms are making her feel worse, causing some difficulty with breathing.  Also she has noticed some pink discharge vs blood in urine when she goes to bathroom, concerned about her history of PTD and also about possible UTI.  Contractions: Not present. Vag. Bleeding: None.  Movement: Present. Denies any leaking of fluid.   The following portions of the patient's history were reviewed and updated as appropriate: allergies, current medications, past family history, past medical history, past social history, past surgical history and problem list.   Objective:   Vitals:   04/04/19 0915  BP: 108/68  Temp: (!) 97.5 F (36.4 C)   Weight: 156 lb (70.8 kg)    Fetal Status:     Movement: Present     General:  Alert, oriented and cooperative. Patient is in no acute distress.  Respiratory: Normal respiratory effort, no problems with respiration noted  Mental Status: Normal mood and affect. Normal behavior. Normal judgment and thought content.  Rest of physical exam deferred due to type of encounter  Imaging: No results found.  Assessment and Plan:  Pregnancy: [id number] at [redacted]w[redacted]d 1. Respiratory symptoms Concerned about COVID, patient is aware of this. She was advised to tell her supervisor about her symptoms and go home and quarantine until she gets tested, recommended getting tested ASAP.  Given her other symptoms, recommended coming to MAU for evaluation and testing. COVID Cepheid test signed and held, can be used at any [facility] ED. Will follow up results and manage accordingly.  2. Hematuria vs pink discharge Concerned about her history too; could also have UTI vs kidney stone. Advised to come to MAU to be evaluated.  3. Previous preterm delivery, antepartum Will need cervical check given possible pink discharge  4. Supervision of high risk pregnancy, antepartum Preterm labor symptoms and general obstetric precautions including but not limited to vaginal bleeding, contractions, leaking of fluid and fetal movement were reviewed in detail with the patient. I discussed the assessment and treatment plan with the patient. The patient was provided an opportunity to ask questions and all were answered. The patient agreed with the plan and demonstrated an understanding of the instructions. The patient was advised to call back or seek an in-person office evaluation/go to MAU at [facility] for any urgent or concerning symptoms. Please refer to After  Visit Summary for other counseling recommendations.   I provided 20 minutes of face-to-face time during this encounter.  Return in about 1 week (around 04/11/2019) for  [facility] and Followup.  Future Appointments  Date Time Provider Department Center  04/04/2019 10:15 AM Shatima Zalar, Jethro BastosUgonna A, MD [facility] WOC    Jaynie CollinsUgonna Sherrel Ploch, MD Center for [facility], [facility]

## 2019-04-14 ENCOUNTER — Telehealth: Payer: 59 | Admitting: Obstetrics and Gynecology

## 2019-04-14 ENCOUNTER — Telehealth: Payer: Self-pay | Admitting: Obstetrics and Gynecology

## 2019-04-14 ENCOUNTER — Encounter: Payer: Self-pay | Admitting: Obstetrics and Gynecology

## 2019-04-14 ENCOUNTER — Other Ambulatory Visit: Payer: Self-pay

## 2019-04-14 DIAGNOSIS — O0990 Supervision of high risk pregnancy, unspecified, unspecified trimester: Secondary | ICD-10-CM

## 2019-04-14 NOTE — Progress Notes (Signed)
Patient will be called to reschedule 

## 2019-04-14 NOTE — Telephone Encounter (Signed)
Called the patient to reschedule the missed appointment. Left a voicemail informing the patient of calling our office, also mailing an appointment reminder.

## 2019-04-14 NOTE — Progress Notes (Signed)
1353-attempted to contact pt to begin virtual appointment. LVM informing of attempts. Will call back in approx. 10-15 minutes.  1404-2nd attempt to contact pt. LVM informing that someone would call to reschedule her missed appointment.

## 2019-04-17 ENCOUNTER — Telehealth: Payer: 59 | Admitting: Obstetrics and Gynecology

## 2019-04-18 ENCOUNTER — Encounter: Payer: Self-pay | Admitting: Obstetrics and Gynecology

## 2019-04-18 ENCOUNTER — Telehealth (INDEPENDENT_AMBULATORY_CARE_PROVIDER_SITE_OTHER): Payer: 59 | Admitting: Obstetrics and Gynecology

## 2019-04-18 ENCOUNTER — Other Ambulatory Visit: Payer: Self-pay

## 2019-04-18 DIAGNOSIS — I471 Supraventricular tachycardia: Secondary | ICD-10-CM

## 2019-04-18 DIAGNOSIS — O0990 Supervision of high risk pregnancy, unspecified, unspecified trimester: Secondary | ICD-10-CM

## 2019-04-18 DIAGNOSIS — O09213 Supervision of pregnancy with history of pre-term labor, third trimester: Secondary | ICD-10-CM

## 2019-04-18 DIAGNOSIS — O09219 Supervision of pregnancy with history of pre-term labor, unspecified trimester: Secondary | ICD-10-CM

## 2019-04-18 DIAGNOSIS — Z3A33 33 weeks gestation of pregnancy: Secondary | ICD-10-CM

## 2019-04-18 DIAGNOSIS — Z9889 Other specified postprocedural states: Secondary | ICD-10-CM

## 2019-04-18 DIAGNOSIS — O0993 Supervision of high risk pregnancy, unspecified, third trimester: Secondary | ICD-10-CM

## 2019-04-18 DIAGNOSIS — F410 Panic disorder [episodic paroxysmal anxiety] without agoraphobia: Secondary | ICD-10-CM

## 2019-04-18 MED ORDER — CLOTRIMAZOLE 1 % VA CREA: 1.0000 | TOPICAL_CREAM | Freq: Every day | VAGINAL | 1 refills | Status: DC

## 2019-04-18 NOTE — Progress Notes (Signed)
I connected with  Catherine Carpenter on 04/18/19 at  9:15 AM EDT by telephone and verified that I am speaking with the correct person using two identifiers.   I discussed the limitations, risks, security and privacy concerns of performing an evaluation and management service by telephone  And virtually and the availability of in person appointments. I also discussed with the patient that there may be a patient responsible charge related to this service. The patient expressed understanding and agreed to proceed.  Is active in [employer] but has not taken bp this week; states can't take today because is at work. I asked her to take blood pressure when she gets home and log it into [employer]. She c/o groin pain. She also c/o was having uc's  And pain week ago and went to  Hospital and checked out. C/O burning with urination; and perineal itching/ irritation.  Had UA checked last week. Will discuss with provider today.  Shyrl Obi,RN 04/18/2019  9:07 AM

## 2019-04-18 NOTE — Progress Notes (Signed)
   [place] VIRTUAL VIDEO VISIT ENCOUNTER NOTE  Provider location: Center for [employer] at [facility]   I connected with Catherine Carpenter on 04/18/19 at  9:15 AM EDT by MyChart Video Encounter at home and verified that I am speaking with the correct person using two identifiers.   I discussed the limitations, risks, security and privacy concerns of performing an evaluation and management service virtually and the availability of in person appointments. I also discussed with the patient that there may be a patient responsible charge related to this service. The patient expressed understanding and agreed to proceed. Subjective:  Catherine Carpenter is a 33 y.o. [phone] at [redacted]w[redacted]d being seen today for ongoing prenatal care.  She is currently monitored for the following issues for this high-risk pregnancy and has Paroxysmal SVT (supraventricular tachycardia) ([place]); Panic disorder; History of rheumatic fever as a child; Supervision of high risk pregnancy, antepartum; Previous preterm delivery, antepartum; Rheumatic fever; and History of loop electrical excision procedure (LEEP) on their problem list.  Patient reports dysuria, vaginal pressure and discomfort, vaginal discharge.  Contractions: Irregular. Vag. Bleeding: None.  Movement: Present. Denies any leaking of fluid.   The following portions of the patient's history were reviewed and updated as appropriate: allergies, current medications, past family history, past medical history, past social history, past surgical history and problem list.   Objective:  There were no vitals filed for this visit.  Fetal Status:     Movement: Present     General:  Alert, oriented and cooperative. Patient is in no acute distress.  Respiratory: Normal respiratory effort, no problems with respiration noted  Mental Status: Normal mood and affect. Normal behavior. Normal judgment and thought content.  Rest of physical exam deferred due to type of  encounter  Imaging: No results found.  Assessment and Plan:  Pregnancy: [id number] at [redacted]w[redacted]d  1. Supervision of high risk pregnancy, antepartum Some dysuria and vaginal discharge, advised not to take azo during pregnancy, declined to drop off urine sample now, will send clotrimazole and she will call if symptoms do not improve  2. Previous preterm delivery, antepartum Declined 17P  3. Paroxysmal SVT (supraventricular tachycardia) ([place])  4. Panic disorder Remains on klonopin  5. History of loop electrical excision procedure (LEEP)  Preterm labor symptoms and general obstetric precautions including but not limited to vaginal bleeding, contractions, leaking of fluid and fetal movement were reviewed in detail with the patient. I discussed the assessment and treatment plan with the patient. The patient was provided an opportunity to ask questions and all were answered. The patient agreed with the plan and demonstrated an understanding of the instructions. The patient was advised to call back or seek an in-person office evaluation/go to MAU at [facility] for any urgent or concerning symptoms. Please refer to After Visit Summary for other counseling recommendations.   I provided 20 minutes of face-to-face time during this encounter.  Return in about 2 weeks (around 05/02/2019) for OB visit (MD), in person as scheduled.  Future Appointments  Date Time Provider [place]  05/02/2019 11:15 AM Sloan Leiter, MD [facility] WOC  05/09/2019  9:15 AM Aletha Halim, MD [facility] WOC  05/16/2019  9:35 AM Harolyn Rutherford, Sallyanne Havers, MD [facility] WOC  05/23/2019  9:35 AM Aletha Halim, MD WOC-WOCA WOC  05/30/2019  9:35 AM Emily Filbert, MD [place], [place] for [facility] [place]

## 2019-04-24 ENCOUNTER — Telehealth: Payer: Self-pay | Admitting: Obstetrics and Gynecology

## 2019-04-24 NOTE — Telephone Encounter (Signed)
Called the patient to inform of the upcoming appointment. Left a detailed voicemail informing of the wearing a face mask, no visitors or children due to [place] restrictions. Also if you have been in contact with someone who has had covid, been diagnosed with covid, or experienced any flu-like symptoms in the last 14 days please call our office to reschedule the appointment. If have any questions our office number is [phone]

## 2019-04-24 NOTE — Telephone Encounter (Signed)
Nurse Morey Hummingbird requested the patient be scheduled for an appointment with yeast infection and pre-term labor concerns.

## 2019-04-25 ENCOUNTER — Encounter: Payer: Self-pay | Admitting: Medical

## 2019-04-25 ENCOUNTER — Encounter: Payer: Self-pay | Admitting: Obstetrics & Gynecology

## 2019-04-25 ENCOUNTER — Other Ambulatory Visit (HOSPITAL_COMMUNITY)
Admission: RE | Admit: 2019-04-25 | Discharge: 2019-04-25 | Disposition: A | Discharge status: Home / Self Care | Payer: 59 | Source: Ambulatory Visit | Attending: Medical | Admitting: Medical

## 2019-04-25 ENCOUNTER — Ambulatory Visit (INDEPENDENT_AMBULATORY_CARE_PROVIDER_SITE_OTHER): Payer: 59 | Admitting: Medical

## 2019-04-25 ENCOUNTER — Other Ambulatory Visit: Payer: Self-pay

## 2019-04-25 VITALS — BP 106/68 | HR 112 | Temp 98.2°F | Wt 156.2 lb

## 2019-04-25 DIAGNOSIS — O0990 Supervision of high risk pregnancy, unspecified, unspecified trimester: Secondary | ICD-10-CM

## 2019-04-25 DIAGNOSIS — N898 Other specified noninflammatory disorders of vagina: Secondary | ICD-10-CM | POA: Diagnosis not present

## 2019-04-25 DIAGNOSIS — Z3A34 34 weeks gestation of pregnancy: Secondary | ICD-10-CM

## 2019-04-25 DIAGNOSIS — O09213 Supervision of pregnancy with history of pre-term labor, third trimester: Secondary | ICD-10-CM

## 2019-04-25 DIAGNOSIS — Z9889 Other specified postprocedural states: Secondary | ICD-10-CM

## 2019-04-25 DIAGNOSIS — O09219 Supervision of pregnancy with history of pre-term labor, unspecified trimester: Secondary | ICD-10-CM

## 2019-04-25 DIAGNOSIS — O0993 Supervision of high risk pregnancy, unspecified, third trimester: Secondary | ICD-10-CM

## 2019-04-25 LAB — POCT URINALYSIS DIP (DEVICE)
Bilirubin Urine: NEGATIVE
Glucose, UA: NEGATIVE mg/dL
Hgb urine dipstick: NEGATIVE
Ketones, ur: NEGATIVE mg/dL
Nitrite: NEGATIVE
Protein, ur: NEGATIVE mg/dL
Specific Gravity, Urine: 1.02 (ref 1.005–1.030)
Urobilinogen, UA: 0.2 mg/dL (ref 0.0–1.0)
pH: 7 (ref 5.0–8.0)

## 2019-04-25 MED ORDER — NYSTATIN 100000 UNIT/GM EX CREA: TOPICAL_CREAM | CUTANEOUS | 0 refills | Status: DC

## 2019-04-25 MED ORDER — TRIAMCINOLONE ACETONIDE 0.1 % EX CREA: 1.0000 "application " | TOPICAL_CREAM | Freq: Two times a day (BID) | CUTANEOUS | 0 refills | Status: DC | PRN

## 2019-04-25 NOTE — Progress Notes (Signed)
2 weeks ago - at [facility] ED - lots of regular contractions IV fluids and medications Improved Still having occasional contractions Increased pressures + vaginal discharge - normal  Continues to have vaginal itching and irritation  No bleeding  + FM    PRENATAL VISIT NOTE  Subjective:  Catherine Carpenter is a 33 y.o. [id number] at [redacted]w[redacted]d being seen today for ongoing prenatal care.  She is currently monitored for the following issues for this high-risk pregnancy and has Paroxysmal SVT (supraventricular tachycardia) ([place]); Panic disorder; History of rheumatic fever as a child; Supervision of high risk pregnancy, antepartum; Previous preterm delivery, antepartum; Rheumatic fever; and History of loop electrical excision procedure (LEEP) on their problem list.  Patient reports occasional contractions and vaginal irritation.  Contractions: Irregular. Vag. Bleeding: None.  Movement: Present. Denies leaking of fluid.   The following portions of the patient's history were reviewed and updated as appropriate: allergies, current medications, past family history, past medical history, past social history, past surgical history and problem list.   Objective:   Vitals:   04/25/19 0923  BP: 106/68  Pulse: (!) 112  Temp: 98.2 F (36.8 C)  Weight: 156 lb 3.2 oz (70.9 kg)    Fetal Status: Fetal Heart Rate (bpm): 148   Movement: Present  Presentation: Vertex  General:  Alert, oriented and cooperative. Patient is in no acute distress.  Skin: Skin is warm and dry. No rash noted.   Cardiovascular: Normal heart rate noted  Respiratory: Normal respiratory effort, no problems with respiration noted  Abdomen: Soft, gravid, appropriate for gestational age.  Pain/Pressure: Present     Pelvic: Cervical exam performed Dilation: Closed Effacement (%): 50 Station: -3 moderate erythema noted of the labia minora  Extremities: Normal range of motion.  Edema: None  Mental Status: Normal mood and affect. Normal behavior.  Normal judgment and thought content.   Assessment and Plan:  Pregnancy: [id number] at [redacted]w[redacted]d 1. Supervision of high risk pregnancy, antepartum - Patient was seen in [place] ED 2 weeks ago with regular contractions - IV Fluids and medication were given and contractions have been irregular and infrequent since then  2. Previous preterm delivery, antepartum - Cervix closed today  - PTL precautions discussed   3. History of loop electrical excision procedure (LEEP)  4. Vaginal itching - Cervicovaginal ancillary only - nystatin cream (MYCOSTATIN); Apply to affected area 2 times daily  Dispense: 15 g; Refill: 0 - triamcinolone cream (KENALOG) 0.1 %; Apply 1 application topically 2 (two) times daily as needed. Use for 7 days only as needed for itching  Dispense: 30 g; Refill: 0  Preterm labor symptoms and general obstetric precautions including but not limited to vaginal bleeding, contractions, leaking of fluid and fetal movement were reviewed in detail with the patient. Please refer to After Visit Summary for other counseling recommendations.   Return in about 1 week (around 05/02/2019) for [facility], In-Person with MD.  Future Appointments  Date Time Provider [place]  05/02/2019 11:15 AM Sloan Leiter, MD [facility] [place]  05/09/2019  9:15 AM Aletha Halim, MD [facility] [place]  05/16/2019  9:35 AM Harolyn Rutherford, Sallyanne Havers, MD [facility]  05/23/2019  9:35 AM Aletha Halim, MD [facility]  05/30/2019  9:35 AM Emily Filbert, MD [facility]    Kerry Hough, PA-C

## 2019-04-25 NOTE — Patient Instructions (Signed)

## 2019-04-25 NOTE — Progress Notes (Signed)
Pt reports increased amount of fatigue as well as pelvic and abdominal pressure. She is also still having vaginal irritation and itching despite course of Gyne-Lotrimin vaginal cream x7 days. Pt also has some frequency of urination and has concerns for possible UTI - urine specimen obtained.

## 2019-04-28 LAB — CERVICOVAGINAL ANCILLARY ONLY
Bacterial vaginitis: NEGATIVE
Candida vaginitis: NEGATIVE

## 2019-04-29 ENCOUNTER — Encounter: Payer: Self-pay | Admitting: General Practice

## 2019-04-29 ENCOUNTER — Telehealth: Payer: Self-pay | Admitting: Family Medicine

## 2019-04-29 NOTE — Telephone Encounter (Signed)
Patient called in stating that her insurance covers her for an electric breast pump and needs someone to send over a prescription. Patient instructed that a message will be put in to the nurses and they will be reaching back out to her. Patient verbalized understanding.

## 2019-04-29 NOTE — Telephone Encounter (Signed)
Called pt and pt requested that we send in an Rx to [phone] with Attn: ID # [phone] to her insurance so that she will be able to get her breast pump.  Notified pt that we will fax the Rx as she requested.  Faxed to the listed telephone #.

## 2019-05-01 ENCOUNTER — Telehealth: Payer: 59 | Admitting: Obstetrics & Gynecology

## 2019-05-01 ENCOUNTER — Telehealth: Payer: Self-pay | Admitting: Obstetrics and Gynecology

## 2019-05-01 NOTE — Telephone Encounter (Signed)
Attempted to call patient about her appointment on 8/28 @ 11:15. No answer left voicemail instructing patient to wear a face mask for the entire appointment and no visitors are allowed during the visit. Patient instructed not to attend the appointment if she was any symptoms. Symptom list and office number left.

## 2019-05-02 ENCOUNTER — Other Ambulatory Visit: Payer: Self-pay

## 2019-05-02 ENCOUNTER — Encounter: Payer: Self-pay | Admitting: Family Medicine

## 2019-05-02 ENCOUNTER — Encounter: Payer: Self-pay | Admitting: Obstetrics and Gynecology

## 2019-05-02 ENCOUNTER — Ambulatory Visit (INDEPENDENT_AMBULATORY_CARE_PROVIDER_SITE_OTHER): Payer: 59 | Admitting: Obstetrics and Gynecology

## 2019-05-02 VITALS — BP 118/70 | HR 87 | Wt 156.0 lb

## 2019-05-02 DIAGNOSIS — O26843 Uterine size-date discrepancy, third trimester: Secondary | ICD-10-CM | POA: Insufficient documentation

## 2019-05-02 DIAGNOSIS — O0993 Supervision of high risk pregnancy, unspecified, third trimester: Secondary | ICD-10-CM | POA: Diagnosis not present

## 2019-05-02 DIAGNOSIS — Z3A35 35 weeks gestation of pregnancy: Secondary | ICD-10-CM | POA: Diagnosis not present

## 2019-05-02 DIAGNOSIS — O09213 Supervision of pregnancy with history of pre-term labor, third trimester: Secondary | ICD-10-CM

## 2019-05-02 DIAGNOSIS — O0990 Supervision of high risk pregnancy, unspecified, unspecified trimester: Secondary | ICD-10-CM

## 2019-05-02 DIAGNOSIS — O09219 Supervision of pregnancy with history of pre-term labor, unspecified trimester: Secondary | ICD-10-CM

## 2019-05-02 NOTE — Patient Instructions (Signed)
Breastfeeding  Choosing to breastfeed is one of the best decisions you can make for yourself and your baby. A change in hormones during pregnancy causes your breasts to make breast milk in your milk-producing glands. Hormones prevent breast milk from being released before your baby is born. They also prompt milk flow after birth. Once breastfeeding has begun, thoughts of your baby, as well as his or her sucking or crying, can stimulate the release of milk from your milk-producing glands. Benefits of breastfeeding Research shows that breastfeeding offers many health benefits for infants and mothers. It also offers a cost-free and convenient way to feed your baby. For your baby  Your first milk (colostrum) helps your baby's digestive system to function better.  Special cells in your milk (antibodies) help your baby to fight off infections.  Breastfed babies are less likely to develop asthma, allergies, obesity, or type 2 diabetes. They are also at lower risk for sudden infant death syndrome (SIDS).  Nutrients in breast milk are better able to meet your babys needs compared to infant formula.  Breast milk improves your baby's brain development. For you  Breastfeeding helps to create a very special bond between you and your baby.  Breastfeeding is convenient. Breast milk costs nothing and is always available at the correct temperature.  Breastfeeding helps to burn calories. It helps you to lose the weight that you gained during pregnancy.  Breastfeeding makes your uterus return faster to its size before pregnancy. It also slows bleeding (lochia) after you give birth.  Breastfeeding helps to lower your risk of developing type 2 diabetes, osteoporosis, rheumatoid arthritis, cardiovascular disease, and breast, ovarian, uterine, and endometrial cancer later in life. Breastfeeding basics Starting breastfeeding  Find a comfortable place to sit or lie down, with your neck and back  well-supported.  Place a pillow or a rolled-up blanket under your baby to bring him or her to the level of your breast (if you are seated). Nursing pillows are specially designed to help support your arms and your baby while you breastfeed.  Make sure that your baby's tummy (abdomen) is facing your abdomen.  Gently massage your breast. With your fingertips, massage from the outer edges of your breast inward toward the nipple. This encourages milk flow. If your milk flows slowly, you may need to continue this action during the feeding.  Support your breast with 4 fingers underneath and your thumb above your nipple (make the letter "C" with your hand). Make sure your fingers are well away from your nipple and your babys mouth.  Stroke your baby's lips gently with your finger or nipple.  When your baby's mouth is open wide enough, quickly bring your baby to your breast, placing your entire nipple and as much of the areola as possible into your baby's mouth. The areola is the colored area around your nipple. ? More areola should be visible above your baby's upper lip than below the lower lip. ? Your baby's lips should be opened and extended outward (flanged) to ensure an adequate, comfortable latch. ? Your baby's tongue should be between his or her lower gum and your breast.  Make sure that your baby's mouth is correctly positioned around your nipple (latched). Your baby's lips should create a seal on your breast and be turned out (everted).  It is common for your baby to suck about 2-3 minutes in order to start the flow of breast milk. Latching Teaching your baby how to latch onto your breast  properly is very important. An improper latch can cause nipple pain, decreased milk supply, and poor weight gain in your baby. Also, if your baby is not latched onto your nipple properly, he or she may swallow some air during feeding. This can make your baby fussy. Burping your baby when you switch breasts  during the feeding can help to get rid of the air. However, teaching your baby to latch on properly is still the best way to prevent fussiness from swallowing air while breastfeeding. Signs that your baby has successfully latched onto your nipple  Silent tugging or silent sucking, without causing you pain. Infant's lips should be extended outward (flanged).  Swallowing heard between every 3-4 sucks once your milk has started to flow (after your let-down milk reflex occurs).  Muscle movement above and in front of his or her ears while sucking. Signs that your baby has not successfully latched onto your nipple  Sucking sounds or smacking sounds from your baby while breastfeeding.  Nipple pain. If you think your baby has not latched on correctly, slip your finger into the corner of your babys mouth to break the suction and place it between your baby's gums. Attempt to start breastfeeding again. Signs of successful breastfeeding Signs from your baby  Your baby will gradually decrease the number of sucks or will completely stop sucking.  Your baby will fall asleep.  Your baby's body will relax.  Your baby will retain a small amount of milk in his or her mouth.  Your baby will let go of your breast by himself or herself. Signs from you  Breasts that have increased in firmness, weight, and size 1-3 hours after feeding.  Breasts that are softer immediately after breastfeeding.  Increased milk volume, as well as a change in milk consistency and color by the fifth day of breastfeeding.  Nipples that are not sore, cracked, or bleeding. Signs that your baby is getting enough milk  Wetting at least 1-2 diapers during the first 24 hours after birth.  Wetting at least 5-6 diapers every 24 hours for the first week after birth. The urine should be clear or pale yellow by the age of 5 days.  Wetting 6-8 diapers every 24 hours as your baby continues to grow and develop.  At least 3 stools in  a 24-hour period by the age of 5 days. The stool should be soft and yellow.  At least 3 stools in a 24-hour period by the age of 7 days. The stool should be seedy and yellow.  No loss of weight greater than 10% of birth weight during the first 3 days of life.  Average weight gain of 4-7 oz (113-198 g) per week after the age of 4 days.  Consistent daily weight gain by the age of 5 days, without weight loss after the age of 2 weeks. After a feeding, your baby may spit up a small amount of milk. This is normal. Breastfeeding frequency and duration Frequent feeding will help you make more milk and can prevent sore nipples and extremely full breasts (breast engorgement). Breastfeed when you feel the need to reduce the fullness of your breasts or when your baby shows signs of hunger. This is called "breastfeeding on demand." Signs that your baby is hungry include:  Increased alertness, activity, or restlessness.  Movement of the head from side to side.  Opening of the mouth when the corner of the mouth or cheek is stroked (rooting).  Increased sucking sounds, smacking  lips, cooing, sighing, or squeaking.  Hand-to-mouth movements and sucking on fingers or hands.  Fussing or crying. Avoid introducing a pacifier to your baby in the first 4-6 weeks after your baby is born. After this time, you may choose to use a pacifier. Research has shown that pacifier use during the first year of a baby's life decreases the risk of sudden infant death syndrome (SIDS). Allow your baby to feed on each breast as long as he or she wants. When your baby unlatches or falls asleep while feeding from the first breast, offer the second breast. Because newborns are often sleepy in the first few weeks of life, you may need to awaken your baby to get him or her to feed. Breastfeeding times will vary from baby to baby. However, the following rules can serve as a guide to help you make sure that your baby is properly  fed:  Newborns (babies 74 weeks of age or younger) may breastfeed every 1-3 hours.  Newborns should not go without breastfeeding for longer than 3 hours during the day or 5 hours during the night.  You should breastfeed your baby a minimum of 8 times in a 24-hour period. Breast milk pumping     Pumping and storing breast milk allows you to make sure that your baby is exclusively fed your breast milk, even at times when you are unable to breastfeed. This is especially important if you go back to work while you are still breastfeeding, or if you are not able to be present during feedings. Your lactation consultant can help you find a method of pumping that works best for you and give you guidelines about how long it is safe to store breast milk. Caring for your breasts while you breastfeed Nipples can become dry, cracked, and sore while breastfeeding. The following recommendations can help keep your breasts moisturized and healthy:  Avoid using soap on your nipples.  Wear a supportive bra designed especially for nursing. Avoid wearing underwire-style bras or extremely tight bras (sports bras).  Air-dry your nipples for 3-4 minutes after each feeding.  Use only cotton bra pads to absorb leaked breast milk. Leaking of breast milk between feedings is normal.  Use lanolin on your nipples after breastfeeding. Lanolin helps to maintain your skin's normal moisture barrier. Pure lanolin is not harmful (not toxic) to your baby. You may also hand express a few drops of breast milk and gently massage that milk into your nipples and allow the milk to air-dry. In the first few weeks after giving birth, some women experience breast engorgement. Engorgement can make your breasts feel heavy, warm, and tender to the touch. Engorgement peaks within 3-5 days after you give birth. The following recommendations can help to ease engorgement:  Completely empty your breasts while breastfeeding or pumping. You may  want to start by applying warm, moist heat (in the shower or with warm, water-soaked hand towels) just before feeding or pumping. This increases circulation and helps the milk flow. If your baby does not completely empty your breasts while breastfeeding, pump any extra milk after he or she is finished.  Apply ice packs to your breasts immediately after breastfeeding or pumping, unless this is too uncomfortable for you. To do this: ? Put ice in a plastic bag. ? Place a towel between your skin and the bag. ? Leave the ice on for 20 minutes, 2-3 times a day.  Make sure that your baby is latched on and positioned properly while  If engorgement persists after 48 hours of following these recommendations, contact your health care provider or a Science writer. Overall health care recommendations while breastfeeding  Eat 3 healthy meals and 3 snacks every day. Well-nourished mothers who are breastfeeding need an additional 450-500 calories a day. You can meet this requirement by increasing the amount of a balanced diet that you eat.  Drink enough water to keep your urine pale yellow or clear.  Rest often, relax, and continue to take your prenatal vitamins to prevent fatigue, stress, and low vitamin and mineral levels in your body (nutrient deficiencies).  Do not use any products that contain nicotine or tobacco, such as cigarettes and e-cigarettes. Your baby may be harmed by chemicals from cigarettes that pass into breast milk and exposure to secondhand smoke. If you need help quitting, ask your health care provider.  Avoid alcohol.  Do not use illegal drugs or marijuana.  Talk with your health care provider before taking any medicines. These include over-the-counter and prescription medicines as well as vitamins and herbal supplements. Some medicines that may be harmful to your baby can pass through breast milk.  It is possible to become pregnant while breastfeeding. If birth  control is desired, ask your health care provider about options that will be safe while breastfeeding your baby. Where to find more information: [employer]: www.llli.org Contact a health care provider if:  You feel like you want to stop breastfeeding or have become frustrated with breastfeeding.  Your nipples are cracked or bleeding.  Your breasts are red, tender, or warm.  You have: ? Painful breasts or nipples. ? A swollen area on either breast. ? A fever or chills. ? Nausea or vomiting. ? Drainage other than breast milk from your nipples.  Your breasts do not become full before feedings by the fifth day after you give birth.  You feel sad and depressed.  Your baby is: ? Too sleepy to eat well. ? Having trouble sleeping. ? More than 33 week old and wetting fewer than 6 diapers in a 24-hour period. ? Not gaining weight by 66 days of age.  Your baby has fewer than 3 stools in a 24-hour period.  Your baby's skin or the white parts of his or her eyes become yellow. Get help right away if:  Your baby is overly tired (lethargic) and does not want to wake up and feed.  Your baby develops an unexplained fever. Summary  Breastfeeding offers many health benefits for infant and mothers.  Try to breastfeed your infant when he or she shows early signs of hunger.  Gently tickle or stroke your baby's lips with your finger or nipple to allow the baby to open his or her mouth. Bring the baby to your breast. Make sure that much of the areola is in your baby's mouth. Offer one side and burp the baby before you offer the other side.  Talk with your health care provider or lactation consultant if you have questions or you face problems as you breastfeed. This information is not intended to replace advice given to you by your health care provider. Make sure you discuss any questions you have with your health care provider. Document Released: 08/21/2005 Document Revised:  11/15/2017 Document Reviewed: 09/22/2016 Elsevier Patient Education  2020 [employer].   Preterm Labor and Birth Information  The normal length of a pregnancy is 39-41 weeks. Preterm labor is when labor starts before 37 completed weeks of pregnancy. What are the  risk factors for preterm labor? Preterm labor is more likely to occur in women who:  Have certain infections during pregnancy such as a bladder infection, sexually transmitted infection, or infection inside the uterus (chorioamnionitis).  Have a shorter-than-normal cervix.  Have gone into preterm labor before.  Have had surgery on their cervix.  Are younger than age 31 or older than age 31.  Are African American.  Are pregnant with twins or multiple babies (multiple gestation).  Take street drugs or smoke while pregnant.  Do not gain enough weight while pregnant.  Became pregnant shortly after having been pregnant. What are the symptoms of preterm labor? Symptoms of preterm labor include:  Cramps similar to those that can happen during a menstrual period. The cramps may happen with diarrhea.  Pain in the abdomen or lower back.  Regular uterine contractions that may feel like tightening of the abdomen.  A feeling of increased pressure in the pelvis.  Increased watery or bloody mucus discharge from the vagina.  Water breaking (ruptured amniotic sac). Why is it important to recognize signs of preterm labor? It is important to recognize signs of preterm labor because babies who are born prematurely may not be fully developed. This can put them at an increased risk for:  Long-term (chronic) heart and lung problems.  Difficulty immediately after birth with regulating body systems, including blood sugar, body temperature, heart rate, and breathing rate.  Bleeding in the brain.  Cerebral palsy.  Learning difficulties.  Death. These risks are highest for babies who are born before 34 weeks of pregnancy. How  is preterm labor treated? Treatment depends on the length of your pregnancy, your condition, and the health of your baby. It may involve:  Having a stitch (suture) placed in your cervix to prevent your cervix from opening too early (cerclage).  Taking or being given medicines, such as: ? Hormone medicines. These may be given early in pregnancy to help support the pregnancy. ? Medicine to stop contractions. ? Medicines to help mature the babys lungs. These may be prescribed if the risk of delivery is high. ? Medicines to prevent your baby from developing cerebral palsy. If the labor happens before 34 weeks of pregnancy, you may need to stay in the hospital. What should I do if I think I am in preterm labor? If you think that you are going into preterm labor, call your health care provider right away. How can I prevent preterm labor in future pregnancies? To increase your chance of having a full-term pregnancy:  Do not use any tobacco products, such as cigarettes, chewing tobacco, and e-cigarettes. If you need help quitting, ask your health care provider.  Do not use street drugs or medicines that have not been prescribed to you during your pregnancy.  Talk with your health care provider before taking any herbal supplements, even if you have been taking them regularly.  Make sure you gain a healthy amount of weight during your pregnancy.  Watch for infection. If you think that you might have an infection, get it checked right away.  Make sure to tell your health care provider if you have gone into preterm labor before. This information is not intended to replace advice given to you by your health care provider. Make sure you discuss any questions you have with your health care provider. Document Released: 11/11/2003 Document Revised: 12/13/2018 Document Reviewed: 01/12/2016 Elsevier Patient Education  2020 [employer]

## 2019-05-02 NOTE — Progress Notes (Signed)
   Subjective:  Catherine Carpenter is a 33 y.o. [phone] at [redacted]w[redacted]d being seen today for ongoing prenatal care.  She is currently monitored for the following issues for this high-risk pregnancy and has Paroxysmal SVT (supraventricular tachycardia) ([place]); Panic disorder; History of rheumatic fever as a child; Supervision of high risk pregnancy, antepartum; Previous preterm delivery, antepartum; Rheumatic fever; History of loop electrical excision procedure (LEEP); and Size of fetus inconsistent with dates in third trimester on their problem list.  Patient reports no complaints.  Contractions: Irregular. Vag. Bleeding: None.  Movement: Present. Denies leaking of fluid.   The following portions of the patient's history were reviewed and updated as appropriate: allergies, current medications, past family history, past medical history, past social history, past surgical history and problem list. Problem list updated.  Objective:   Vitals:   05/02/19 1131  BP: 118/70  Pulse: 87  Weight: 156 lb (70.8 kg)    Fetal Status: Fetal Heart Rate (bpm): 130 Fundal Height: 33 cm Movement: Present     General:  Alert, oriented and cooperative. Patient is in no acute distress.  Skin: Skin is warm and dry. No rash noted.   Cardiovascular: Normal heart rate noted  Respiratory: Normal respiratory effort, no problems with respiration noted  Abdomen: Soft, gravid, appropriate for gestational age. Pain/Pressure: Present     Pelvic: Vag. Bleeding: None Vag D/C Character: White   Cervical exam deferred        Extremities: Normal range of motion.  Edema: Trace  Mental Status: Normal mood and affect. Normal behavior. Normal judgment and thought content.   Urinalysis:      Assessment and Plan:  Pregnancy: [id number] at [redacted]w[redacted]d  1. Supervision of high risk pregnancy, antepartum GBS collected today F/u in one week, prefers in person visits - Culture, beta strep (group b only)  2. Size of fetus inconsistent with dates in  third trimester Measuring 33cm, she is 36wks tomorrow Will send for follow up growth scan Vertex position confirmed on bedside ultrasound - [place] MFM OB FOLLOW UP; Future  3. Previous preterm delivery, antepartum Counseled and declined 17 P this pregnancy  Preterm labor symptoms and general obstetric precautions including but not limited to vaginal bleeding, contractions, leaking of fluid and fetal movement were reviewed in detail with the patient. Please refer to After Visit Summary for other counseling recommendations.  Return in about 1 week (around 05/09/2019) for [facility], needs MD, in person.   Clarnce Flock, MD

## 2019-05-02 NOTE — Progress Notes (Signed)
Patient states she may have lost her mucous plug.

## 2019-05-05 ENCOUNTER — Telehealth: Payer: Self-pay | Admitting: *Deleted

## 2019-05-05 ENCOUNTER — Telehealth: Payer: Self-pay | Admitting: Obstetrics and Gynecology

## 2019-05-05 LAB — CULTURE, BETA STREP (GROUP B ONLY): Strep Gp B Culture: NEGATIVE

## 2019-05-05 NOTE — Telephone Encounter (Signed)
The patient called in stating she experienced contractions this morning. She also stated she feels that she needs to be seen but doesn't want to go to ER. She would like a call back as to what she should do. She has an appointment on Wednesday with our office as well as with ultrasound.

## 2019-05-05 NOTE — Telephone Encounter (Signed)
Catherine Carpenter called and left a message this am that she was up until 3:30 with contractions every 6-8 minutes apart and then slowed down. States she is still having uc's but doesn't want to go to ED if it is just braxton hicks. States she has appointment tomorrow with MD and [place] and that she called into work today. Wants to know if she can get an appointment today for ob fu and [place].  States she lives an hour away.   Per review patient is [redacted]w[redacted]d with hx PTL.  I called [name] and she states she is still feeling uc's every 8-10 minutes apart and states it feels like baby is reaching down . I instructed her I understand she doesn't want to go to ED and sent home but the best place for her to evaluated is [facility] because we are concerned she is in Preterm labor and if she is - that is the best place because they can completely evaluate her with exam, ultrasound, medication if she is in PTL and admit her.  She voices understanding.  [name],RN

## 2019-05-05 NOTE — Telephone Encounter (Signed)
Catherine Carpenter has also left a voicemail and has been called about these issues and instructed to go to hospital to be evaluated. [name],RN

## 2019-05-07 ENCOUNTER — Other Ambulatory Visit: Payer: Self-pay

## 2019-05-07 ENCOUNTER — Other Ambulatory Visit (HOSPITAL_COMMUNITY)
Admission: RE | Admit: 2019-05-07 | Discharge: 2019-05-07 | Disposition: A | Discharge status: Home / Self Care | Payer: 59 | Source: Ambulatory Visit | Attending: Obstetrics and Gynecology | Admitting: Obstetrics and Gynecology

## 2019-05-07 ENCOUNTER — Ambulatory Visit (HOSPITAL_COMMUNITY)
Admission: RE | Admit: 2019-05-07 | Discharge: 2019-05-07 | Disposition: A | Discharge status: Home / Self Care | Payer: 59 | Source: Ambulatory Visit | Attending: Obstetrics and Gynecology | Admitting: Obstetrics and Gynecology

## 2019-05-07 ENCOUNTER — Ambulatory Visit (INDEPENDENT_AMBULATORY_CARE_PROVIDER_SITE_OTHER): Payer: 59 | Admitting: Obstetrics & Gynecology

## 2019-05-07 VITALS — BP 115/71 | HR 61 | Temp 98.5°F

## 2019-05-07 DIAGNOSIS — O09213 Supervision of pregnancy with history of pre-term labor, third trimester: Secondary | ICD-10-CM

## 2019-05-07 DIAGNOSIS — O0990 Supervision of high risk pregnancy, unspecified, unspecified trimester: Secondary | ICD-10-CM

## 2019-05-07 DIAGNOSIS — Z362 Encounter for other antenatal screening follow-up: Secondary | ICD-10-CM

## 2019-05-07 DIAGNOSIS — O0993 Supervision of high risk pregnancy, unspecified, third trimester: Secondary | ICD-10-CM

## 2019-05-07 DIAGNOSIS — Z3A36 36 weeks gestation of pregnancy: Secondary | ICD-10-CM

## 2019-05-07 DIAGNOSIS — O26843 Uterine size-date discrepancy, third trimester: Secondary | ICD-10-CM

## 2019-05-07 DIAGNOSIS — O09219 Supervision of pregnancy with history of pre-term labor, unspecified trimester: Secondary | ICD-10-CM

## 2019-05-07 DIAGNOSIS — O3443 Maternal care for other abnormalities of cervix, third trimester: Secondary | ICD-10-CM

## 2019-05-07 NOTE — Progress Notes (Signed)
   PRENATAL VISIT NOTE  Subjective:  Catherine Carpenter is a 33 y.o. [phone] at [redacted]w[redacted]d being seen today for ongoing prenatal care.  She is currently monitored for the following issues for this high-risk pregnancy and has Paroxysmal SVT (supraventricular tachycardia) ([place]); Panic disorder; History of rheumatic fever as a child; Supervision of high risk pregnancy, antepartum; Previous preterm delivery, antepartum; Rheumatic fever; History of loop electrical excision procedure (LEEP); and Size of fetus inconsistent with dates in third trimester on their problem list.  Patient reports occasional contractions.  Contractions: Irritability. Vag. Bleeding: None.  Movement: Present. Denies leaking of fluid.   The following portions of the patient's history were reviewed and updated as appropriate: allergies, current medications, past family history, past medical history, past social history, past surgical history and problem list.   Objective:   Vitals:   05/07/19 1505  BP: 115/71  Pulse: 61  Temp: 98.5 F (36.9 C)    Fetal Status: Fetal Heart Rate (bpm): 145   Movement: Present     General:  Alert, oriented and cooperative. Patient is in no acute distress.  Skin: Skin is warm and dry. No rash noted.   Cardiovascular: Normal heart rate noted  Respiratory: Normal respiratory effort, no problems with respiration noted  Abdomen: Soft, gravid, appropriate for gestational age.  Pain/Pressure: Present     Pelvic: Cervical exam deferred        Extremities: Normal range of motion.  Edema: Trace  Mental Status: Normal mood and affect. Normal behavior. Normal judgment and thought content.   Assessment and Plan:  Pregnancy: [id number] at [redacted]w[redacted]d 1. Supervision of high risk pregnancy, antepartum Routine screen - GC/Chlamydia probe amp ([place])not at [facility] 2. Previous preterm delivery, antepartum PTL was ruled out at [facility] in [place] recently  Preterm labor symptoms and general obstetric precautions  including but not limited to vaginal bleeding, contractions, leaking of fluid and fetal movement were reviewed in detail with the patient. Please refer to After Visit Summary for other counseling recommendations.   Return in about 9 days (around 05/16/2019).  Future Appointments  Date Time Provider [place]  05/16/2019  9:35 AM Anyanwu, Sallyanne Havers, MD [facility]  05/23/2019  9:35 AM Aletha Halim, MD [facility]  05/30/2019  9:35 AM Emily Filbert, MD [facility]    Emeterio Reeve, MD

## 2019-05-07 NOTE — Patient Instructions (Signed)

## 2019-05-08 ENCOUNTER — Encounter: Payer: Self-pay | Admitting: *Deleted

## 2019-05-08 LAB — GC/CHLAMYDIA PROBE AMP (~~LOC~~) NOT AT ARMC
Chlamydia: NEGATIVE
Neisseria Gonorrhea: NEGATIVE

## 2019-05-09 ENCOUNTER — Encounter: Payer: 59 | Admitting: Obstetrics and Gynecology

## 2019-05-16 ENCOUNTER — Ambulatory Visit (INDEPENDENT_AMBULATORY_CARE_PROVIDER_SITE_OTHER): Payer: 59 | Admitting: Obstetrics & Gynecology

## 2019-05-16 ENCOUNTER — Other Ambulatory Visit: Payer: Self-pay

## 2019-05-16 VITALS — BP 107/73 | HR 77 | Wt 160.4 lb

## 2019-05-16 DIAGNOSIS — Z23 Encounter for immunization: Secondary | ICD-10-CM

## 2019-05-16 DIAGNOSIS — O0990 Supervision of high risk pregnancy, unspecified, unspecified trimester: Secondary | ICD-10-CM

## 2019-05-16 DIAGNOSIS — O0993 Supervision of high risk pregnancy, unspecified, third trimester: Secondary | ICD-10-CM

## 2019-05-16 NOTE — Progress Notes (Signed)
   PRENATAL VISIT NOTE  Subjective:  Catherine Carpenter is a 33 y.o. [phone] at [redacted]w[redacted]d being seen today for ongoing prenatal care.  She is currently monitored for the following issues for this low-risk pregnancy and has Paroxysmal SVT (supraventricular tachycardia) ([place]); Panic disorder; History of rheumatic fever as a child; Supervision of high risk pregnancy, antepartum; Previous preterm delivery, antepartum; Rheumatic fever; History of loop electrical excision procedure (LEEP); and Size of fetus inconsistent with dates in third trimester on their problem list.  Patient reports no complaints but wants to be induced soon.  Contractions: Irregular. Vag. Bleeding: None.  Movement: Present. Denies leaking of fluid.   The following portions of the patient's history were reviewed and updated as appropriate: allergies, current medications, past family history, past medical history, past social history, past surgical history and problem list.   Objective:   Vitals:   05/16/19 0947  BP: 107/73  Pulse: 77  Weight: 160 lb 6.4 oz (72.8 kg)    Fetal Status: Fetal Heart Rate (bpm): 132 Fundal Height: 35 cm Movement: Present  Presentation: Vertex  General:  Alert, oriented and cooperative. Patient is in no acute distress.  Skin: Skin is warm and dry. No rash noted.   Cardiovascular: Normal heart rate noted  Respiratory: Normal respiratory effort, no problems with respiration noted  Abdomen: Soft, gravid, appropriate for gestational age.  Pain/Pressure: Present     Pelvic: Cervical exam performed Dilation: Closed Effacement (%): Thick Station: -3  Extremities: Normal range of motion.  Edema: Trace  Mental Status: Normal mood and affect. Normal behavior. Normal judgment and thought content.   Assessment and Plan:  Pregnancy: [id number] at [redacted]w[redacted]d 1. Supervision of high risk pregnancy, antepartum Requested elective induction; had a long discussion with her as to why this is not recommended especially given her  non-favorable cervix. Recommended methods to help with getting into spontaneous labor, handout given to her. Flu vaccine given today. - Flu Vaccine QUAD 36+ mos IM  Term labor symptoms and general obstetric precautions including but not limited to vaginal bleeding, contractions, leaking of fluid and fetal movement were reviewed in detail with the patient. Please refer to After Visit Summary for other counseling recommendations.   Return in about 1 week (around 05/23/2019) for OFFICE LOB Visit.  Future Appointments  Date Time Provider [place]  05/23/2019  9:35 AM Aletha Halim, MD [facility]  05/30/2019  9:35 AM Emily Filbert, MD [place]    Verita Schneiders, MD

## 2019-05-16 NOTE — Patient Instructions (Addendum)
  Cervical Ripening: May try one or both  Red Raspberry Leaf capsules:  two 300mg  or 400mg  tablets with each meal, 2-3 times a day  Potential Side Effects Of Raspberry Leaf:  Most women do not experience any side effects from drinking raspberry leaf tea. However, nausea and loose stools are possible    Evening Primrose Oil capsules: may take 1 to 3 capsules daily. May also prick one to release the oil and insert it into your vagina at night.  Some of the potential side effects:  Upset stomach  Loose stools or diarrhea  Headaches  Nausea:     Return to office for any scheduled appointments. Call the office or go to the MAU at [place] at [facility] if:  You begin to have strong, frequent contractions  Your water breaks.  Sometimes it is a big gush of fluid, sometimes it is just a trickle that keeps getting your panties wet or running down your legs  You have vaginal bleeding.  It is normal to have a small amount of spotting if your cervix was checked.   You do not feel your baby moving like normal.  If you do not, get something to eat and drink and lay down and focus on feeling your baby move.   If your baby is still not moving like normal, you should call the office or go to MAU.  Any other obstetric concerns.

## 2019-05-21 ENCOUNTER — Other Ambulatory Visit: Payer: Self-pay

## 2019-05-21 ENCOUNTER — Inpatient Hospital Stay (HOSPITAL_COMMUNITY)
Admission: AD | Admit: 2019-05-21 | Discharge: 2019-05-23 | DRG: 805 | Disposition: A | Discharge status: Home / Self Care | Payer: 59 | Attending: Obstetrics and Gynecology | Admitting: Obstetrics and Gynecology

## 2019-05-21 ENCOUNTER — Encounter (HOSPITAL_COMMUNITY): Payer: Self-pay | Admitting: *Deleted

## 2019-05-21 DIAGNOSIS — O9942 Diseases of the circulatory system complicating childbirth: Secondary | ICD-10-CM | POA: Diagnosis present

## 2019-05-21 DIAGNOSIS — I471 Supraventricular tachycardia, unspecified: Secondary | ICD-10-CM | POA: Diagnosis present

## 2019-05-21 DIAGNOSIS — F410 Panic disorder [episodic paroxysmal anxiety] without agoraphobia: Secondary | ICD-10-CM | POA: Diagnosis present

## 2019-05-21 DIAGNOSIS — O4292 Full-term premature rupture of membranes, unspecified as to length of time between rupture and onset of labor: Principal | ICD-10-CM | POA: Diagnosis present

## 2019-05-21 DIAGNOSIS — Z9889 Other specified postprocedural states: Secondary | ICD-10-CM

## 2019-05-21 DIAGNOSIS — O99344 Other mental disorders complicating childbirth: Secondary | ICD-10-CM | POA: Diagnosis present

## 2019-05-21 DIAGNOSIS — Z3689 Encounter for other specified antenatal screening: Secondary | ICD-10-CM | POA: Diagnosis not present

## 2019-05-21 DIAGNOSIS — O26893 Other specified pregnancy related conditions, third trimester: Secondary | ICD-10-CM | POA: Diagnosis present

## 2019-05-21 DIAGNOSIS — Z3A38 38 weeks gestation of pregnancy: Secondary | ICD-10-CM

## 2019-05-21 DIAGNOSIS — Z87891 Personal history of nicotine dependence: Secondary | ICD-10-CM | POA: Diagnosis not present

## 2019-05-21 DIAGNOSIS — O4290 Premature rupture of membranes, unspecified as to length of time between rupture and onset of labor, unspecified weeks of gestation: Secondary | ICD-10-CM | POA: Diagnosis present

## 2019-05-21 DIAGNOSIS — Z20828 Contact with and (suspected) exposure to other viral communicable diseases: Secondary | ICD-10-CM | POA: Diagnosis present

## 2019-05-21 DIAGNOSIS — O0990 Supervision of high risk pregnancy, unspecified, unspecified trimester: Secondary | ICD-10-CM

## 2019-05-21 DIAGNOSIS — O09219 Supervision of pregnancy with history of pre-term labor, unspecified trimester: Secondary | ICD-10-CM

## 2019-05-21 LAB — CBC
HCT: 35.1 % — ABNORMAL LOW (ref 36.0–46.0)
Hemoglobin: 11.7 g/dL — ABNORMAL LOW (ref 12.0–15.0)
MCH: 33.3 pg (ref 26.0–34.0)
MCHC: 33.3 g/dL (ref 30.0–36.0)
MCV: 100 fL (ref 80.0–100.0)
Platelets: 203 10*3/uL (ref 150–400)
RBC: 3.51 MIL/uL — ABNORMAL LOW (ref 3.87–5.11)
RDW: 12.4 % (ref 11.5–15.5)
WBC: 9 10*3/uL (ref 4.0–10.5)
nRBC: 0 % (ref 0.0–0.2)

## 2019-05-21 LAB — ABO/RH: ABO/RH(D): O POS

## 2019-05-21 LAB — TYPE AND SCREEN
ABO/RH(D): O POS
Antibody Screen: NEGATIVE

## 2019-05-21 LAB — SARS CORONAVIRUS 2 BY RT PCR (HOSPITAL ORDER, PERFORMED IN ~~LOC~~ HOSPITAL LAB): SARS Coronavirus 2: NEGATIVE

## 2019-05-21 MED ORDER — SOD CITRATE-CITRIC ACID 500-334 MG/5ML PO SOLN: 30.0000 mL | ORAL | Status: DC | PRN

## 2019-05-21 MED ORDER — LACTATED RINGERS IV SOLN
INTRAVENOUS | Status: DC
  Administered 2019-05-21: 19:00:00 via INTRAVENOUS

## 2019-05-21 MED ORDER — OXYCODONE-ACETAMINOPHEN 5-325 MG PO TABS: 1.0000 | ORAL_TABLET | ORAL | Status: DC | PRN

## 2019-05-21 MED ORDER — OXYTOCIN 40 UNITS IN NORMAL SALINE INFUSION - SIMPLE MED
2.5000 [IU]/h | INTRAVENOUS | Status: DC
  Administered 2019-05-22: 2.5 [IU]/h via INTRAVENOUS
  Filled 2019-05-21: qty 1000

## 2019-05-21 MED ORDER — ACETAMINOPHEN 325 MG PO TABS
650.0000 mg | ORAL_TABLET | ORAL | Status: DC | PRN
  Administered 2019-05-22: 650 mg via ORAL
  Filled 2019-05-21: qty 2

## 2019-05-21 MED ORDER — FENTANYL CITRATE (PF) 100 MCG/2ML IJ SOLN
50.0000 ug | INTRAMUSCULAR | Status: DC | PRN
  Administered 2019-05-21: 50 ug via INTRAVENOUS
  Administered 2019-05-21: 22:00:00 100 ug via INTRAVENOUS
  Administered 2019-05-21: 50 ug via INTRAVENOUS
  Administered 2019-05-21: 100 ug via INTRAVENOUS
  Filled 2019-05-21 (×4): qty 2

## 2019-05-21 MED ORDER — LACTATED RINGERS IV SOLN: 500.0000 mL | INTRAVENOUS | Status: DC | PRN

## 2019-05-21 MED ORDER — OXYTOCIN 40 UNITS IN NORMAL SALINE INFUSION - SIMPLE MED
1.0000 m[IU]/min | INTRAVENOUS | Status: DC
  Administered 2019-05-21: 2 m[IU]/min via INTRAVENOUS

## 2019-05-21 MED ORDER — OXYTOCIN BOLUS FROM INFUSION
500.0000 mL | Freq: Once | INTRAVENOUS | Status: AC
  Administered 2019-05-21: 500 mL via INTRAVENOUS

## 2019-05-21 MED ORDER — MISOPROSTOL 50MCG HALF TABLET
50.0000 ug | ORAL_TABLET | Freq: Once | ORAL | Status: AC
  Administered 2019-05-21: 18:00:00 50 ug via ORAL
  Filled 2019-05-21: qty 1

## 2019-05-21 MED ORDER — DIPHENHYDRAMINE HCL 25 MG PO CAPS
25.0000 mg | ORAL_CAPSULE | Freq: Every evening | ORAL | Status: DC | PRN
  Filled 2019-05-21: qty 1

## 2019-05-21 MED ORDER — OXYCODONE-ACETAMINOPHEN 5-325 MG PO TABS: 2.0000 | ORAL_TABLET | ORAL | Status: DC | PRN

## 2019-05-21 MED ORDER — CLONAZEPAM 0.5 MG PO TABS
1.0000 mg | ORAL_TABLET | Freq: Two times a day (BID) | ORAL | Status: DC | PRN
  Administered 2019-05-22 – 2019-05-23 (×4): 1 mg via ORAL
  Filled 2019-05-21 (×4): qty 2

## 2019-05-21 MED ORDER — ONDANSETRON HCL 4 MG/2ML IJ SOLN: 4.0000 mg | Freq: Four times a day (QID) | INTRAMUSCULAR | Status: DC | PRN

## 2019-05-21 MED ORDER — LIDOCAINE HCL (PF) 1 % IJ SOLN: 30.0000 mL | INTRAMUSCULAR | Status: DC | PRN

## 2019-05-21 NOTE — Progress Notes (Signed)
Patient ID: Catherine Carpenter, female   DOB: 01-05-1986, 33 y.o.   MRN: [id number]  S/p cytotec x 1 dose; feeling crampy; still leaking fluid  BP 120/76, P 64 FHR 150s, +accels, no decels, occ variables Ctx irreg 2-5 mins Cx 3/90/vtx -2  IUP@38 .4wks PROM Cx now favorable GBS neg  Plan to start Pit 2x2 now Anticipate vag del  Myrtis Ser CNM 05/21/2019 10:02 PM

## 2019-05-21 NOTE — MAU Note (Signed)
.   Catherine Carpenter is a 33 y.o. at [redacted]w[redacted]d here in MAU reporting:leakage of fluid that started around noon today. Mild lower abdominal cramping  Onset of complaint: noon Pain score: 1 Vitals:   05/21/19 1356  BP: 118/77  Pulse: 69  Resp: 16  Temp: 97.9 F (36.6 C)  SpO2: 100%     FHT:135 Lab orders placed from triage:

## 2019-05-21 NOTE — MAU Provider Note (Signed)
Pt informed that the ultrasound is considered a limited OB ultrasound and is not intended to be a complete ultrasound exam.  Patient also informed that the ultrasound is not being completed with the intent of assessing for fetal or placental anomalies or any pelvic abnormalities.  Explained that the purpose of today's ultrasound is to assess for  viability.  Patient acknowledges the purpose of the exam and the limitations of the study.     Vertex positive.  Noni Saupe I, NP 05/21/2019 2:13 PM

## 2019-05-21 NOTE — H&P (Addendum)
OBSTETRIC ADMISSION HISTORY AND PHYSICAL  Catherine Carpenter is a 33 y.o. female [phone] with IUP at [redacted]w[redacted]d by LMP presenting with ROM around noon today.   Reports spontaneous ROM with clear fluid around noon today. Reports fetal movement and irregular contractions. Denies cough and SOB.   She received her prenatal care at [facility].  Support person in labor: FOB - Lawrence Santiago . Anatomy U/S: normal  Prenatal History/Complications: . SVT, history of ablations (has had no recurrence of SVT since ablation therapy) . Prior history of preterm delivery at 35.3 wks in 2006 . History of LEEP in 2012, last pap 06/2018 negative   Past Medical History: Past Medical History:  Diagnosis Date  . History of rheumatic fever as a child   . Panic disorder   . Paroxysmal SVT (supraventricular tachycardia) ([place])   . Preterm labor   . Vaginal Pap smear, abnormal    LEEP 2014    Past Surgical History: Past Surgical History:  Procedure Laterality Date  . CARDIAC ELECTROPHYSIOLOGY STUDY AND ABLATION    . INDUCED ABORTION    . LEEP  2014  . WISDOM TOOTH EXTRACTION      Obstetrical History: OB History    Gravida  3   Para  1   Term  0   Preterm  1   AB  1   Living  1     SAB      TAB  1   Ectopic      Multiple      Live Births  1           Social History: Social History   Socioeconomic History  . Marital status: Single    Spouse name: Not on file  . Number of children: Not on file  . Years of education: Not on file  . Highest education level: Not on file  Occupational History    Comment: unemployed  Social Needs  . Financial resource strain: Not hard at all  . Food insecurity    Worry: Never true    Inability: Sometimes true  . Transportation needs    Medical: No    Non-medical: No  Tobacco Use  . Smoking status: Former Smoker    Packs/day: 0.50    Types: Cigarettes    Quit date: 08/04/2018    Years since quitting: 0.7  . Smokeless tobacco: Never Used   Substance and Sexual Activity  . Alcohol use: Not Currently    Alcohol/week: 4.0 standard drinks    Types: 4 Glasses of wine per week    Comment: socially  . Drug use: Never  . Sexual activity: Yes    Partners: Male    Birth control/protection: None  Lifestyle  . Physical activity    Days per week: Not on file    Minutes per session: Not on file  . Stress: Not on file  Relationships  . Social Herbalist on phone: Not on file    Gets together: Not on file    Attends religious service: Not on file    Active member of club or organization: Not on file    Attends meetings of clubs or organizations: Not on file    Relationship status: Not on file  Other Topics Concern  . Not on file  Social History Narrative  . Not on file    Family History: Family History  Problem Relation Age of Onset  . Thyroid disease Mother   . Depression Mother   .  Diabetes Mother        Hypoglycemia  . Depression Father   . Hypertension Father   . Hyperlipidemia Father   . Depression Sister   . Anxiety disorder Sister   . Epilepsy Sister   . Fibroids Sister   . Anxiety disorder Brother   . Depression Brother   . Depression Brother   . Anxiety disorder Brother   . Anxiety disorder Brother   . Depression Brother   . Diabetes Paternal Grandmother     Allergies: Allergies  Allergen Reactions  . Penicillins Hives    Medications Prior to Admission  Medication Sig Dispense Refill Last Dose  . clonazePAM (KLONOPIN) 1 MG tablet TAKE 1 TABLET BY MOUTH 2 TIMES DAILY AS NEEDED FOR ANXIETY. 60 tablet 0 05/21/2019 at Unknown time  . diphenhydrAMINE (BENADRYL) 25 MG tablet Take 25 mg by mouth at bedtime as needed.   05/20/2019 at Unknown time  . Prenatal Vit-Fe Fumarate-FA (MULTIVITAMIN-PRENATAL) 27-0.8 MG TABS tablet Take 1 tablet by mouth daily at 12 noon.   05/21/2019 at Unknown time     Review of Systems  All systems reviewed and negative except as stated in HPI  Blood pressure  118/77, pulse 69, temperature 98.3 F (36.8 C), temperature source Oral, resp. rate 16, height 5\' 7"  (1.702 m), weight 72.8 kg, last menstrual period 08/24/2018, SpO2 100 %. General appearance: alert, cooperative, appears stated age and mild distress Lungs: no respiratory distress Heart: regular rate  Abdomen: soft, non-tender; gravid b Pelvic: Fingertip dilation, 50% effacement, -3 station Extremities: no sign of DVT Presentation: cephalic Fetal monitoring: baseline rate 130, moderate variability, 15x15 accelerations, variable decelerations Uterine activity: irregular pattern Dilation: Fingertip Effacement (%): 50 Station: -3 Exam by:: MD  Prenatal labs: ABO, Rh: O/Positive/-- (03/06 1017) Antibody: Negative (03/06 1017) Rubella: 5.45 (03/06 1017) RPR: Non Reactive (07/17 0845)  HBsAg: Negative (03/06 1017)  HIV: Non Reactive (07/17 0845)  GBS: Negative/-- (08/28 1139)  Glucola: Negative test on 03/21/19 Genetic screening:  NIPS: low risk female   Prenatal Transfer Tool  Maternal Diabetes: No Genetic Screening: Declined Maternal Ultrasounds/Referrals: Normal Fetal Ultrasounds or other Referrals:  Referred to Materal Fetal Medicine  Maternal Substance Abuse:  No Significant Maternal Medications:  Meds include: Other: clonazepam Significant Maternal Lab Results: Group B Strep negative  Results for orders placed or performed during the hospital encounter of 05/21/19 (from the past 24 hour(s))  CBC   Collection Time: 05/21/19  2:16 PM  Result Value Ref Range   WBC 9.0 4.0 - 10.5 K/uL   RBC 3.51 (L) 3.87 - 5.11 MIL/uL   Hemoglobin 11.7 (L) 12.0 - 15.0 g/dL   HCT 34.1 (L) 96.2 - 22.9 %   MCV 100.0 80.0 - 100.0 fL   MCH 33.3 26.0 - 34.0 pg   MCHC 33.3 30.0 - 36.0 g/dL   RDW 79.8 92.1 - 19.4 %   Platelets 203 150 - 400 K/uL   nRBC 0.0 0.0 - 0.2 %  SARS Coronavirus 2 [facility] order, Performed in [facility] hospital lab) Nasopharyngeal Nasopharyngeal Swab   Collection Time:  05/21/19  2:23 PM   Specimen: Nasopharyngeal Swab  Result Value Ref Range   SARS Coronavirus 2 NEGATIVE NEGATIVE  Type and screen [facility]   Collection Time: 05/21/19  2:25 PM  Result Value Ref Range   ABO/RH(D) O POS    Antibody Screen NEG    Sample Expiration      05/24/2019,2359 Performed at [facility]  Lab, [address] [place], [place] [address]   ABO/Rh   Collection Time: 05/21/19  2:25 PM  Result Value Ref Range   ABO/RH(D)      O POS Performed at [facility], [address] [place], [place] [address]     Patient Active Problem List   Diagnosis Date Noted  . Normal labor 05/21/2019  . Size of fetus inconsistent with dates in third trimester 05/02/2019  . History of loop electrical excision procedure (LEEP) 11/22/2018  . Supervision of high risk pregnancy, antepartum 11/08/2018  . Previous preterm delivery, antepartum 11/08/2018  . Rheumatic fever 03/08/2018  . Paroxysmal SVT (supraventricular tachycardia) ([facility])   . Panic disorder   . History of rheumatic fever as a child     Assessment/Plan:  Shellee Milohoebe Dominik is a 33 y.o. [phone] at 539w4d here for spontaneous vaginal delivery after ROM at noon today.  Labor: Fingertip dilated. Start with buccal cytotec, consider starting Pitocin as appropriate.  -- pain control: IV meds, desires to labor as naturally as possible   Fetal Wellbeing: EFW 7 lb by Leopold's. Cephalic by Leopold's.  -- GBS negative  -- continuous fetal monitoring - baseline rate 130, moderate variability, 15x15 accelerations, variable decelerations  Postpartum Planning -- Feeding: breast -- Contraception: declines -- Tdap 03/21/19   Osvaldo Shipperiffany K. Gibson, [facility] MS3 05/21/19, 1430  GME ATTESTATION:  I saw and evaluated the patient with the medical student. I agree with the findings and the plan of care as documented in the medical student's note.  Marlowe AltSparacino, Bonifacio Pruden L, DO OB Fellow, Faculty Practice 05/21/2019 8:14  PM

## 2019-05-22 ENCOUNTER — Encounter: Payer: Self-pay | Admitting: Obstetrics and Gynecology

## 2019-05-22 ENCOUNTER — Encounter (HOSPITAL_COMMUNITY): Payer: Self-pay

## 2019-05-22 DIAGNOSIS — Z3A38 38 weeks gestation of pregnancy: Secondary | ICD-10-CM

## 2019-05-22 LAB — RPR: RPR Ser Ql: NONREACTIVE

## 2019-05-22 MED ORDER — DIPHENHYDRAMINE HCL 25 MG PO CAPS: 25.0000 mg | ORAL_CAPSULE | Freq: Four times a day (QID) | ORAL | Status: DC | PRN

## 2019-05-22 MED ORDER — ZOLPIDEM TARTRATE 5 MG PO TABS: 5.0000 mg | ORAL_TABLET | Freq: Every evening | ORAL | Status: DC | PRN

## 2019-05-22 MED ORDER — TETANUS-DIPHTH-ACELL PERTUSSIS 5-2.5-18.5 LF-MCG/0.5 IM SUSP: 0.5000 mL | Freq: Once | INTRAMUSCULAR | Status: DC

## 2019-05-22 MED ORDER — PRENATAL MULTIVITAMIN CH
1.0000 | ORAL_TABLET | Freq: Every day | ORAL | Status: DC
  Administered 2019-05-23: 1 via ORAL
  Filled 2019-05-22: qty 1

## 2019-05-22 MED ORDER — ONDANSETRON HCL 4 MG PO TABS: 4.0000 mg | ORAL_TABLET | ORAL | Status: DC | PRN

## 2019-05-22 MED ORDER — SIMETHICONE 80 MG PO CHEW: 80.0000 mg | CHEWABLE_TABLET | ORAL | Status: DC | PRN

## 2019-05-22 MED ORDER — ACETAMINOPHEN 325 MG PO TABS
650.0000 mg | ORAL_TABLET | ORAL | Status: DC | PRN
  Administered 2019-05-22 – 2019-05-23 (×2): 650 mg via ORAL
  Filled 2019-05-22 (×3): qty 2

## 2019-05-22 MED ORDER — OXYCODONE HCL 5 MG PO TABS: 5.0000 mg | ORAL_TABLET | ORAL | Status: DC | PRN

## 2019-05-22 MED ORDER — DIBUCAINE (PERIANAL) 1 % EX OINT
1.0000 "application " | TOPICAL_OINTMENT | CUTANEOUS | Status: DC | PRN
  Administered 2019-05-22: 1 via RECTAL
  Filled 2019-05-22: qty 28

## 2019-05-22 MED ORDER — IBUPROFEN 600 MG PO TABS
600.0000 mg | ORAL_TABLET | Freq: Four times a day (QID) | ORAL | Status: DC
  Administered 2019-05-22 – 2019-05-23 (×6): 600 mg via ORAL
  Filled 2019-05-22 (×6): qty 1

## 2019-05-22 MED ORDER — SENNOSIDES-DOCUSATE SODIUM 8.6-50 MG PO TABS
2.0000 | ORAL_TABLET | ORAL | Status: DC
  Administered 2019-05-22 (×2): 2 via ORAL
  Filled 2019-05-22 (×2): qty 2

## 2019-05-22 MED ORDER — COCONUT OIL OIL
1.0000 "application " | TOPICAL_OIL | Status: DC | PRN
  Administered 2019-05-22: 1 via TOPICAL

## 2019-05-22 MED ORDER — BENZOCAINE-MENTHOL 20-0.5 % EX AERO
1.0000 "application " | INHALATION_SPRAY | CUTANEOUS | Status: DC | PRN
  Administered 2019-05-22: 1 via TOPICAL
  Filled 2019-05-22 (×2): qty 56

## 2019-05-22 MED ORDER — WITCH HAZEL-GLYCERIN EX PADS: 1.0000 "application " | MEDICATED_PAD | CUTANEOUS | Status: DC | PRN

## 2019-05-22 MED ORDER — ONDANSETRON HCL 4 MG/2ML IJ SOLN: 4.0000 mg | INTRAMUSCULAR | Status: DC | PRN

## 2019-05-22 MED ORDER — MEASLES, MUMPS & RUBELLA VAC IJ SOLR: 0.5000 mL | Freq: Once | INTRAMUSCULAR | Status: DC

## 2019-05-22 NOTE — Lactation Note (Signed)
This note was copied from a baby's chart. Lactation Consultation Note  Patient Name: Catherine Carpenter [id number] Date: 05/22/2019 Reason for consult: Initial assessment   LC Initial Visit:  Attempted to visit with mother, however, she was asleep.  Baby was asleep in the bassinet with father at bedside.  Asked father to call me back when mother awakens.  Father will call; stated mother had a couple of questions about breast feeding.   Maternal Data    Feeding    LATCH Score                   Interventions    Lactation Tools Discussed/Used     Consult Status Consult Status: Follow-up Date: 05/22/19 Follow-up type: In-patient    Abigail Marsiglia R Nayib Remer 05/22/2019, 5:40 PM

## 2019-05-22 NOTE — Lactation Note (Addendum)
This note was copied from a baby's chart. Lactation Consultation Note  Patient Name: Catherine Carpenter [id number] Date: 05/22/2019 Reason for consult: Initial assessment;Early term 37-38.6wks P2, 31 hour female infant. Infant had 4 voids and 5 stools since birth. Mom has breastfeed infant 7 times with a few attempts.  Mom has ordered DEBP with her insurance waiting for it to be delivered to her home. Per mom, she feels breastfeeding is going well, LC did not observe latch, infant breastfed for 20 minutes prior to [facility] entering the room.  Per parents, infant has been breastfeeding 30 to 40 minutes most feedings. Mom was doing STS when [facility] was in room.  Parents had lots of questions concerning: milk storage, pumping, breast soreness, how to identify a good latch and  how often to breastfeed infant. [place] answered parents questions and concerns. Nurse will give mom coconut oil for sore nipples. Mom knows to breastfeed infant according hunger cues, 8 to 12 times within 24 hours and on demand. Mom knows to ask Nurse or [facility]  If she has any further questions, concerns or if she needs  assistance with latching infant to breast. Reviewed Baby & Me book's Breastfeeding Basics.  Mom made aware of O/P services, breastfeeding support groups, community resources, and our phone # for post-discharge questions.  Maternal Data Formula Feeding for Exclusion: No Has patient been taught Hand Expression?: Yes Does the patient have breastfeeding experience prior to this delivery?: Yes  Feeding    LATCH Score                   Interventions Interventions: Breast feeding basics reviewed;DEBP;Hand pump  Lactation Tools Discussed/Used WIC Program: No   Consult Status Consult Status: Follow-up Date: 05/23/19 Follow-up type: In-patient    Vicente Serene 05/22/2019, 11:39 PM

## 2019-05-22 NOTE — Discharge Summary (Signed)
Postpartum Discharge Summary     Patient Name: Catherine Carpenter DOB: 12-05-1985 MRN: [id number]  Date of admission: 05/21/2019 Delivering Provider: Serita Grammes D   Date of discharge: 05/23/2019  Admitting diagnosis: Water broke Intrauterine pregnancy: [redacted]w[redacted]d    Secondary diagnosis:  Active Problems:   Paroxysmal SVT (supraventricular tachycardia) (HCC)   Panic disorder   Previous preterm delivery, antepartum   History of loop electrical excision procedure (LEEP)   Premature rupture of membranes  Additional problems: none     Discharge diagnosis: Term Pregnancy Delivered                                                                                                Post partum procedures:None  Augmentation: Pitocin and Cytotec  Complications: None  Hospital course:  Induction of Labor With Vaginal Delivery   33y.o. yo [id number] admitted to the hospital 05/21/2019 for induction of labor.  Indication for induction: PROM.  Patient had an uncomplicated labor course including one dose of cytotec followed by Pitocin 4 hrs later, then she progressed to vag del 2 hrs later. Membrane Rupture Time/Date: 12:00 PM ,05/21/2019   Intrapartum Procedures: Episiotomy: None [1]                                         Lacerations:  None [1]  Patient had delivery of a Viable infant.  Information for the patient's newborn:  CuAnn, Bohne0[[id number]]Delivery Method: Vaginal, Spontaneous(Filed from Delivery Summary)    05/21/2019  Details of delivery can be found in separate delivery note.  Patient had a routine postpartum course. Patient is discharged home 05/23/19. Delivery time: 11:42 PM    Magnesium Sulfate received: No BMZ received: No Rhophylac:N/A MMR:N/A Transfusion:No  Physical exam  Vitals:   05/22/19 0730 05/22/19 1126 05/22/19 1439 05/22/19 2147  BP: 114/70 117/75 112/63 107/64  Pulse: 63 68 64   Resp: 16 16 15 16   Temp: 98.7 F (37.1 C) 98.3 F (36.8 C)  98.3 F (36.8 C) 98.2 F (36.8 C)  TempSrc: Oral Oral Oral Oral  SpO2: 100% 100% 100% 99%  Weight:      Height:       General: alert Lochia: appropriate Uterine Fundus: firm Incision: N/A DVT Evaluation: No evidence of DVT seen on physical exam. Labs: Lab Results  Component Value Date   WBC 9.0 05/21/2019   HGB 11.7 (L) 05/21/2019   HCT 35.1 (L) 05/21/2019   MCV 100.0 05/21/2019   PLT 203 05/21/2019   CMP Latest Ref Rng & Units 11/28/2017  Glucose 65 - 99 mg/dL 82  BUN 6 - 20 mg/dL 10  Creatinine 0.57 - 1.00 mg/dL 0.70  Sodium 134 - 144 mmol/L 138  Potassium 3.5 - 5.2 mmol/L 4.3  Chloride 96 - 106 mmol/L 100  CO2 20 - 29 mmol/L 24  Calcium 8.7 - 10.2 mg/dL 9.8  Total Protein 6.0 - 8.5 g/dL 7.5  Total Bilirubin 0.0 - 1.2 mg/dL <0.2  Alkaline Phos 39 - 117 IU/L 55  AST 0 - 40 IU/L 14  ALT 0 - 32 IU/L 13    Discharge instruction: per After Visit Summary and "Baby and Me Booklet".  After visit meds:  Allergies as of 05/23/2019      Reactions   Penicillins Hives   Did it involve swelling of the face/tongue/throat, SOB, or low BP? No Did it involve sudden or severe rash/hives, skin peeling, or any reaction on the inside of your mouth or nose? No Did you need to seek medical attention at a hospital or doctor's office? No When did it last happen? If all above answers are "NO", may proceed with cephalosporin use.      Medication List    TAKE these medications   acetaminophen 325 MG tablet Commonly known as: Tylenol Take 2 tablets (650 mg total) by mouth every 4 (four) hours as needed (for pain scale < 4).   calcium carbonate 500 MG chewable tablet Commonly known as: TUMS - dosed in mg elemental calcium Chew 2 tablets by mouth as needed for indigestion or heartburn.   clonazePAM 1 MG tablet Commonly known as: KLONOPIN TAKE 1 TABLET BY MOUTH 2 TIMES DAILY AS NEEDED FOR ANXIETY.   diphenhydrAMINE 25 MG tablet Commonly known as: BENADRYL Take 25 mg by mouth  at bedtime as needed for sleep.   ibuprofen 600 MG tablet Commonly known as: ADVIL Take 1 tablet (600 mg total) by mouth every 6 (six) hours.   multivitamin-prenatal 27-0.8 MG Tabs tablet Take 1 tablet by mouth daily at 12 noon.   polyethylene glycol powder 17 GM/SCOOP powder Commonly known as: GLYCOLAX/MIRALAX Take 255 g by mouth once for 1 dose.       Diet: routine diet  Activity: Advance as tolerated. Pelvic rest for 6 weeks.   Outpatient follow up:4 weeks Follow up Appt: Future Appointments  Date Time Provider [place]  06/26/2019 10:55 AM Rasch, Artist Pais, NP [facility]   Follow up Visit:  Please schedule this patient for Postpartum visit in: 4 weeks with the following provider: Any provider For C/S patients schedule nurse incision check in weeks 2 weeks: no Low risk pregnancy complicated by: none Delivery mode:  SVD Anticipated Birth Control:  none PP Procedures needed: none  Schedule Integrated BH visit: please offer   Newborn Data: Live born female  Birth Weight: 2815gm (6lb 3.3oz)  APGAR: 65, 8  Newborn Delivery   Birth date/time: 05/21/2019 23:42:00 Delivery type: Vaginal, Spontaneous      Baby Feeding: Breast Disposition:home with mother   05/23/2019 Clarnce Flock, MD

## 2019-05-22 NOTE — Progress Notes (Signed)
MOB was referred for history of depression/anxiety. * Referral screened out by Clinical Social Worker because none of the following criteria appear to apply: ~ History of anxiety/depression during this pregnancy, or of post-partum depression following prior delivery. ~ Diagnosis of anxiety and/or depression within last 3 years OR * MOB's symptoms currently being treated with medication and/or therapy. Per further chart review, MOB is on  Klonapine for her anxiety and panic disorder.     Please contact the Clinical Social Worker if needs arise, by MOB request, or if MOB scores greater than 9/yes to question 10 on Edinburgh Postpartum Depression Screen.    Jovonta Levit S. Solymar Grace, MSW, LCSW [facility] [phone]  

## 2019-05-22 NOTE — Progress Notes (Signed)
Post Partum Day #1 Subjective: no complaints, up ad lib and tolerating PO; breastfeeding going well; declines contraception  Objective: Blood pressure 114/70, pulse 63, temperature 98.7 F (37.1 C), temperature source Oral, resp. rate 16, height 5\' 7"  (1.702 m), weight 72.8 kg, last menstrual period 08/24/2018, SpO2 100 %, unknown if currently breastfeeding.  Physical Exam:  General: alert, cooperative and no distress Lochia: appropriate Uterine Fundus: firm DVT Evaluation: No evidence of DVT seen on physical exam.  Recent Labs    05/21/19 1416  HGB 11.7*  HCT 35.1*    Assessment/Plan: Plan for discharge tomorrow   LOS: 1 day   Myrtis Ser CNM 05/22/2019, 9:08 AM

## 2019-05-23 ENCOUNTER — Encounter: Payer: 59 | Admitting: Obstetrics and Gynecology

## 2019-05-23 MED ORDER — IBUPROFEN 600 MG PO TABS: 600.0000 mg | ORAL_TABLET | Freq: Four times a day (QID) | ORAL | 0 refills | Status: DC

## 2019-05-23 MED ORDER — ACETAMINOPHEN 325 MG PO TABS: 650.0000 mg | ORAL_TABLET | ORAL | 0 refills | Status: DC | PRN

## 2019-05-23 MED ORDER — POLYETHYLENE GLYCOL 3350 17 GM/SCOOP PO POWD: 1.0000 | Freq: Once | ORAL | 0 refills | Status: AC

## 2019-05-23 NOTE — Lactation Note (Signed)
This note was copied from a baby's chart. Lactation Consultation Note;  Infant is 65 hours old. Mother reports that her nipples are very sore.  Mother reports that she was in lots of pain during the night with cluster feeding. Observed that nipples tissue is very pink but no cracking. She has a scab from a positional strip on the left nipple.  Mother reports that she is only feeding in cross cradle. Infant placed in football hold. Mother taught to do off sided latch.  Infant latched but mother reports pain scale of 2-3. Assist with flanging infants upper lip and lower jaw.   Mother denies further pain. Mother taught to do breast compression. Observed infant with steady burst of suckling and swallows. infant sustained latch for 25 mins. Infant was still breastfeeding when I left the room.   Advised mother in treatment and prevention of engorgement.  Encouraged mother to continue to cue base feed and to allow for cluster feeding.  Mother has an appt with her Peds in am,. There is  an LC in the office. She was encouraged to have a feeding assessment with the  [place] . Mother is aware of available [place] services at [facility]  Patient Name: Girl Andrienne Havener [id number] Date: 05/23/2019 Reason for consult: Follow-up assessment   Maternal Data    Feeding Feeding Type: Breast Fed  LATCH Score Latch: Grasps breast easily, tongue down, lips flanged, rhythmical sucking.  Audible Swallowing: Spontaneous and intermittent  Type of Nipple: Everted at rest and after stimulation  Comfort (Breast/Nipple): Filling, red/small blisters or bruises, mild/mod discomfort  Hold (Positioning): Assistance needed to correctly position infant at breast and maintain latch.  LATCH Score: 8  Interventions Interventions: Assisted with latch;Breast massage;Breast compression;Adjust position;Support pillows;Position options;Comfort gels;Hand pump  Lactation Tools Discussed/Used     Consult Status Consult Status:  Complete    Darla Lesches 05/23/2019, 12:11 PM

## 2019-05-23 NOTE — Discharge Instructions (Signed)

## 2019-05-30 ENCOUNTER — Encounter: Payer: 59 | Admitting: Obstetrics & Gynecology

## 2019-06-16 ENCOUNTER — Encounter: Payer: Self-pay | Admitting: *Deleted

## 2019-06-26 ENCOUNTER — Encounter: Payer: Self-pay | Admitting: Obstetrics and Gynecology

## 2019-06-26 ENCOUNTER — Encounter: Payer: Self-pay | Admitting: Family Medicine

## 2019-06-26 ENCOUNTER — Other Ambulatory Visit: Payer: Self-pay

## 2019-06-26 ENCOUNTER — Ambulatory Visit (INDEPENDENT_AMBULATORY_CARE_PROVIDER_SITE_OTHER): Payer: 59 | Admitting: Obstetrics and Gynecology

## 2019-06-26 VITALS — BP 100/69 | HR 83 | Wt 140.0 lb

## 2019-06-26 DIAGNOSIS — Z1389 Encounter for screening for other disorder: Secondary | ICD-10-CM | POA: Diagnosis not present

## 2019-06-26 DIAGNOSIS — Z3009 Encounter for other general counseling and advice on contraception: Secondary | ICD-10-CM | POA: Insufficient documentation

## 2019-06-26 MED ORDER — NORETHINDRONE 0.35 MG PO TABS: 1.0000 | ORAL_TABLET | Freq: Every day | ORAL | 11 refills | Status: DC

## 2019-06-26 NOTE — Progress Notes (Signed)
Subjective:     Catherine Carpenter is a 33 y.o. female who presents for a postpartum visit. She is 5 weeks postpartum following a spontaneous vaginal delivery. I have fully reviewed the prenatal and intrapartum course. The delivery was at 38w gestational weeks. Outcome: spontaneous vaginal delivery. Anesthesia: none. Postpartum course has been unremarkable. Baby's course has been unremarkable. Baby is feeding by breast. Bleeding no bleeding. Bowel function is normal. Bladder function is normal. Patient is not sexually active. Contraception method is OCP (estrogen/progesterone). Postpartum depression screening: Negative  The following portions of the patient's history were reviewed and updated as appropriate: allergies, current medications, past family history, past medical history, past social history, past surgical history and problem list.  Review of Systems Pertinent items are noted in HPI.   Objective:    BP 100/69   Pulse 83   Wt 140 lb (63.5 kg)   BMI 21.93 kg/m   General:  alert and cooperative  Lungs: clear to auscultation bilaterally  Heart:  regular rate and rhythm, S1, S2 normal, no murmur, click, rub or gallop  Abdomen: soft, non-tender; bowel sounds normal; no masses,  no organomegaly        Assessment:   Normal postpartum exam. Pap smear not done at today's visit.  Last pap 2019 and it was normal   Plan:   1. Contraception: progestin only pills  2. Return to work filled out today.  3. Follow up as needed.   Lezlie Lye, NP 06/26/2019 4:34 PM

## 2019-07-22 ENCOUNTER — Telehealth: Payer: 59 | Admitting: Nurse Practitioner

## 2019-07-22 DIAGNOSIS — N760 Acute vaginitis: Secondary | ICD-10-CM

## 2019-07-22 DIAGNOSIS — B9689 Other specified bacterial agents as the cause of diseases classified elsewhere: Secondary | ICD-10-CM | POA: Diagnosis not present

## 2019-07-22 MED ORDER — CLINDAMYCIN PHOSPHATE 2 % VA CREA: 1.0000 | TOPICAL_CREAM | Freq: Every day | VAGINAL | 0 refills | Status: DC

## 2019-07-22 NOTE — Progress Notes (Signed)
We are sorry that you are not feeling well. Here is how we plan to help! Based on what you shared with me it looks like you: May have a vaginosis due to bacteria  Vaginosis is an inflammation of the vagina that can result in discharge, itching and pain. The cause is usually a change in the normal balance of vaginal bacteria or an infection. Vaginosis can also result from reduced estrogen levels after menopause.  The most common causes of vaginosis are:   Bacterial vaginosis which results from an overgrowth of one on several organisms that are normally present in your vagina.   Yeast infections which are caused by a naturally occurring fungus called candida.   Vaginal atrophy (atrophic vaginosis) which results from the thinning of the vagina from reduced estrogen levels after menopause.   Trichomoniasis which is caused by a parasite and is commonly transmitted by sexual intercourse.  Factors that increase your risk of developing vaginosis include: Marland Kitchen Medications, such as antibiotics and steroids . Uncontrolled diabetes . Use of hygiene products such as bubble bath, vaginal spray or vaginal deodorant . Douching . Wearing damp or tight-fitting clothing . Using an intrauterine device (IUD) for birth control . Hormonal changes, such as those associated with pregnancy, birth control pills or menopause . Sexual activity . Having a sexually transmitted infection  Your treatment plan is Clindamycin vaginal cream 5 grams applied vaginally for 7 days.  I have electronically sent this prescription into the pharmacy that you have chosen. This was selected due to breastfeeding. There is not a one day treatment for bacterial vag8inosis.  Be sure to take all of the medication as directed. Stop taking any medication if you develop a rash, tongue swelling or shortness of breath. Mothers who are breast feeding should consider pumping and discarding their breast milk while on these antibiotics. However, there  is no consensus that infant exposure at these doses would be harmful.  Remember that medication creams can weaken latex condoms. Marland Kitchen   HOME CARE:  Good hygiene may prevent some types of vaginosis from recurring and may relieve some symptoms:  . Avoid baths, hot tubs and whirlpool spas. Rinse soap from your outer genital area after a shower, and dry the area well to prevent irritation. Don't use scented or harsh soaps, such as those with deodorant or antibacterial action. [place] Kitchen Avoid irritants. These include scented tampons and pads. . Wipe from front to back after using the toilet. Doing so avoids spreading fecal bacteria to your vagina.  Other things that may help prevent vaginosis include:  Marland Kitchen Don't douche. Your vagina doesn't require cleansing other than normal bathing. Repetitive douching disrupts the normal organisms that reside in the vagina and can actually increase your risk of vaginal infection. Douching won't clear up a vaginal infection. . Use a latex condom. Both female and female latex condoms may help you avoid infections spread by sexual contact. . Wear cotton underwear. Also wear pantyhose with a cotton crotch. If you feel comfortable without it, skip wearing underwear to bed. Yeast thrives in [employer] Your symptoms should improve in the next day or two.  GET HELP RIGHT AWAY IF:  . You have pain in your lower abdomen ( pelvic area or over your ovaries) . You develop nausea or vomiting . You develop a fever . Your discharge changes or worsens . You have persistent pain with intercourse . You develop shortness of breath, a rapid pulse, or you faint.  These symptoms could be  signs of problems or infections that need to be evaluated by a medical provider now.  MAKE SURE YOU    Understand these instructions.  Will watch your condition.  Will get help right away if you are not doing well or get worse.  Your e-visit answers were reviewed by a board certified advanced  clinical practitioner to complete your personal care plan. Depending upon the condition, your plan could have included both over the counter or prescription medications. Please review your pharmacy choice to make sure that you have choses a pharmacy that is open for you to pick up any needed prescription, Your safety is important to [place]. If you have drug allergies check your prescription carefully.   You can use MyChart to ask questions about today's visit, request a non-urgent call back, or ask for a work or school excuse for 24 hours related to this e-Visit. If it has been greater than 24 hours you will need to follow up with your provider, or enter a new e-Visit to address those concerns. You will get a MyChart message within the next two days asking about your experience. I hope that your e-visit has been valuable and will speed your recovery.  5-10 minutes spent reviewing and documenting in chart.

## 2019-08-19 ENCOUNTER — Ambulatory Visit: Payer: 59 | Admitting: Obstetrics and Gynecology

## 2019-08-26 ENCOUNTER — Encounter: Payer: Self-pay | Admitting: *Deleted

## 2019-09-01 ENCOUNTER — Ambulatory Visit: Payer: 59 | Admitting: Nurse Practitioner

## 2019-12-03 ENCOUNTER — Telehealth: Payer: 59 | Admitting: Family

## 2019-12-03 DIAGNOSIS — N898 Other specified noninflammatory disorders of vagina: Secondary | ICD-10-CM

## 2019-12-03 DIAGNOSIS — M549 Dorsalgia, unspecified: Secondary | ICD-10-CM

## 2019-12-03 NOTE — Progress Notes (Signed)
Based on what you shared with me, I feel your condition warrants further evaluation and I recommend that you be seen for a face to face office visit.  Given you are having vaginal discharge with back pain you need to be seen face to face to be evaluated for other causes. Yeast infections usually do not cause lower back pain.    NOTE: If you entered your credit card information for this eVisit, you will not be charged. You may see a "hold" on your card for the $35 but that hold will drop off and you will not have a charge processed.   If you are having a true medical emergency please call 911.      For an urgent face to face visit, [place] has five urgent care centers for your convenience:      NEW:  [facility] at [facility] Directions [phone] [address] [place], [place] [address] . 10 am - 6pm Monday - Friday    [facility]) Get Driving Directions [phone] [address] [place], [place] [address] . 10 am to 8 pm Monday-Friday . 12 pm to 8 pm [facility] at [facility] Get Driving Directions [phone] [address] [place], [place] [address] . 8 am to 8 pm Monday-Friday . 9 am to 6 pm Saturday . 11 am to 6 pm Sunday     [facility] at [facility] Get Driving Directions  [phone] [address] [place], [place] [address] . 8 am to 8 pm Monday-Friday . 8 am to 4 pm [facility] at [facility] Directions [phone] [address] [place], [place] [address] . 12 pm to 6 pm Monday-Friday      Your e-visit answers were reviewed by a board certified advanced clinical practitioner to complete your personal care plan.  Thank you for using e-Visits.

## 2019-12-31 ENCOUNTER — Ambulatory Visit (INDEPENDENT_AMBULATORY_CARE_PROVIDER_SITE_OTHER): Payer: Self-pay | Admitting: *Deleted

## 2019-12-31 ENCOUNTER — Other Ambulatory Visit: Payer: Self-pay

## 2019-12-31 ENCOUNTER — Encounter: Payer: Self-pay | Admitting: Family Medicine

## 2019-12-31 DIAGNOSIS — Z3200 Encounter for pregnancy test, result unknown: Secondary | ICD-10-CM

## 2019-12-31 LAB — POCT PREGNANCY, URINE: Preg Test, Ur: POSITIVE — AB

## 2019-12-31 NOTE — Progress Notes (Signed)
Pt informed of positive pregnancy test. She was advised to begin taking prenatal vitamins. Pt reports sure LMP 11/10/19.  EDD 08/16/20. Pt instructed to go to [facility] if she develops heavy vaginal bleeding or severe pelvic pain. She will schedule prenatal care in this office. Pt voiced understanding of all information and instructions given.

## 2020-01-05 NOTE — Progress Notes (Signed)
Patient seen and assessed by nursing staff during this encounter. I have reviewed the chart and agree with the documentation and plan. I have also made any necessary editorial changes.  Camary Sosa, MD 01/05/2020 9:52 AM   

## 2020-01-21 ENCOUNTER — Other Ambulatory Visit: Payer: Self-pay

## 2020-01-21 DIAGNOSIS — Z3490 Encounter for supervision of normal pregnancy, unspecified, unspecified trimester: Secondary | ICD-10-CM

## 2020-01-28 ENCOUNTER — Other Ambulatory Visit: Payer: Self-pay

## 2020-01-28 ENCOUNTER — Ambulatory Visit
Admission: RE | Admit: 2020-01-28 | Discharge: 2020-01-28 | Disposition: A | Discharge status: Home / Self Care | Payer: Medicaid - Out of State | Source: Ambulatory Visit | Attending: Certified Nurse Midwife | Admitting: Certified Nurse Midwife

## 2020-01-28 ENCOUNTER — Other Ambulatory Visit: Payer: Self-pay | Admitting: Certified Nurse Midwife

## 2020-01-28 DIAGNOSIS — Z3490 Encounter for supervision of normal pregnancy, unspecified, unspecified trimester: Secondary | ICD-10-CM | POA: Diagnosis present

## 2020-02-03 ENCOUNTER — Encounter: Payer: BLUE CROSS/BLUE SHIELD | Admitting: Obstetrics and Gynecology

## 2020-02-09 ENCOUNTER — Other Ambulatory Visit: Payer: Self-pay

## 2020-02-09 ENCOUNTER — Ambulatory Visit (INDEPENDENT_AMBULATORY_CARE_PROVIDER_SITE_OTHER): Payer: Medicaid Other | Admitting: *Deleted

## 2020-02-09 DIAGNOSIS — O09899 Supervision of other high risk pregnancies, unspecified trimester: Secondary | ICD-10-CM | POA: Insufficient documentation

## 2020-02-09 DIAGNOSIS — O0990 Supervision of high risk pregnancy, unspecified, unspecified trimester: Secondary | ICD-10-CM | POA: Insufficient documentation

## 2020-02-09 DIAGNOSIS — Z8659 Personal history of other mental and behavioral disorders: Secondary | ICD-10-CM

## 2020-02-09 DIAGNOSIS — O99891 Other specified diseases and conditions complicating pregnancy: Secondary | ICD-10-CM | POA: Insufficient documentation

## 2020-02-09 NOTE — Progress Notes (Signed)
I connected with  [name] on 02/09/20 at  1:15 PM EDT by telephone and verified that I am speaking with the correct person using two identifiers.   I discussed the limitations, risks, security and privacy concerns of performing an evaluation and management service by telephone and the availability of in person appointments. I also discussed with the patient that there may be a patient responsible charge related to this service. The patient expressed understanding and agreed to proceed.  I explained I am completing her New OB Intake today. She had her pregnancy confirmed in our office with urine pregnancy test. We discussed Her EDD and that it is based on  sure LMP .  I reviewed her allergies, meds, OB History, Medical /Surgical history, and appropriate screenings.   I explained I will send her the [employer] app and app was sent to her while on phone. She did not receive the email and I explained I will reach out to [employer] and for her to recheck email later today and tomorrow.   I explained we will Have her check her blood pressure weekly and she informed me she still has the  Cuff from her last pregnancy. Explained  then we will have her take her blood pressure weekly and enter into the app.  I explained she will have some visits in office and some virtually. She already has Sports coach. I reviewed her new ob  appointment date/ time with her , our location and to wear mask, no visitors. I did have registrar change appointment from CNM to MD due to History of PTD. [place] does request appointments be on Monday afternoons. She would still like to have panorama drawn next week and I informed her we can have a lab only appointment next week and then new ob as scheduled 03/01/20.  I scheduled an [place] at 19 weeks and gave her the appointment. She voices understanding.  Kwabena Strutz,RN 02/09/2020  1:22 PM

## 2020-02-09 NOTE — Addendum Note (Signed)
Addended by: Gerome Apley on: 02/09/2020 04:44 PM   Modules accepted: Orders

## 2020-02-11 ENCOUNTER — Encounter: Payer: Self-pay | Admitting: *Deleted

## 2020-02-13 NOTE — Progress Notes (Signed)
I have reviewed the nursing assessment and I agree.

## 2020-02-16 ENCOUNTER — Other Ambulatory Visit: Payer: Medicaid Other

## 2020-02-16 ENCOUNTER — Encounter: Payer: BLUE CROSS/BLUE SHIELD | Admitting: Certified Nurse Midwife

## 2020-02-16 ENCOUNTER — Other Ambulatory Visit: Payer: Self-pay | Admitting: *Deleted

## 2020-02-16 ENCOUNTER — Other Ambulatory Visit: Payer: Self-pay

## 2020-02-16 DIAGNOSIS — O0990 Supervision of high risk pregnancy, unspecified, unspecified trimester: Secondary | ICD-10-CM

## 2020-02-17 LAB — OBSTETRIC PANEL, INCLUDING HIV
Antibody Screen: NEGATIVE
Basophils Absolute: 0 10*3/uL (ref 0.0–0.2)
Basos: 0 %
EOS (ABSOLUTE): 0.1 10*3/uL (ref 0.0–0.4)
Eos: 2 %
HIV Screen 4th Generation wRfx: NONREACTIVE
Hematocrit: 32.2 % — ABNORMAL LOW (ref 34.0–46.6)
Hemoglobin: 10.8 g/dL — ABNORMAL LOW (ref 11.1–15.9)
Hepatitis B Surface Ag: NEGATIVE
Immature Grans (Abs): 0 10*3/uL (ref 0.0–0.1)
Immature Granulocytes: 0 %
Lymphocytes Absolute: 2.3 10*3/uL (ref 0.7–3.1)
Lymphs: 27 %
MCH: 31.6 pg (ref 26.6–33.0)
MCHC: 33.5 g/dL (ref 31.5–35.7)
MCV: 94 fL (ref 79–97)
Monocytes Absolute: 0.7 10*3/uL (ref 0.1–0.9)
Monocytes: 8 %
Neutrophils Absolute: 5.2 10*3/uL (ref 1.4–7.0)
Neutrophils: 63 %
Platelets: 247 10*3/uL (ref 150–450)
RBC: 3.42 x10E6/uL — ABNORMAL LOW (ref 3.77–5.28)
RDW: 12 % (ref 11.7–15.4)
RPR Ser Ql: NONREACTIVE
Rh Factor: POSITIVE
Rubella Antibodies, IGG: 4.3 index (ref >0.99)
WBC: 8.3 10*3/uL (ref 3.4–10.8)

## 2020-02-20 ENCOUNTER — Telehealth: Payer: Self-pay | Admitting: Obstetrics and Gynecology

## 2020-02-20 NOTE — Telephone Encounter (Signed)
Patient is requesting a call, she want to discuss her lab results.

## 2020-02-23 ENCOUNTER — Telehealth (INDEPENDENT_AMBULATORY_CARE_PROVIDER_SITE_OTHER): Payer: Medicaid Other | Admitting: General Practice

## 2020-02-23 DIAGNOSIS — Z7189 Other specified counseling: Secondary | ICD-10-CM

## 2020-02-23 NOTE — Telephone Encounter (Signed)
Patient called into office asking about gender test results. Told patient results still aren't back yet and take a minimum of 7-10 days. Told patient results will be available in the [employer] connects as soon as they are available. Patient verbalized understanding.

## 2020-02-26 ENCOUNTER — Encounter: Payer: Medicaid Other | Admitting: Obstetrics & Gynecology

## 2020-03-01 ENCOUNTER — Encounter: Payer: Self-pay | Admitting: Obstetrics and Gynecology

## 2020-03-01 ENCOUNTER — Ambulatory Visit (INDEPENDENT_AMBULATORY_CARE_PROVIDER_SITE_OTHER): Payer: Medicaid Other | Admitting: Obstetrics and Gynecology

## 2020-03-01 ENCOUNTER — Other Ambulatory Visit (HOSPITAL_COMMUNITY)
Admission: RE | Admit: 2020-03-01 | Discharge: 2020-03-01 | Disposition: A | Discharge status: Home / Self Care | Payer: Medicaid Other | Source: Ambulatory Visit | Attending: Obstetrics and Gynecology | Admitting: Obstetrics and Gynecology

## 2020-03-01 ENCOUNTER — Other Ambulatory Visit: Payer: Self-pay

## 2020-03-01 VITALS — BP 107/69 | HR 98 | Wt 144.8 lb

## 2020-03-01 DIAGNOSIS — O0992 Supervision of high risk pregnancy, unspecified, second trimester: Secondary | ICD-10-CM

## 2020-03-01 DIAGNOSIS — I471 Supraventricular tachycardia: Secondary | ICD-10-CM

## 2020-03-01 DIAGNOSIS — O0990 Supervision of high risk pregnancy, unspecified, unspecified trimester: Secondary | ICD-10-CM

## 2020-03-01 DIAGNOSIS — O09892 Supervision of other high risk pregnancies, second trimester: Secondary | ICD-10-CM

## 2020-03-01 DIAGNOSIS — Z9889 Other specified postprocedural states: Secondary | ICD-10-CM

## 2020-03-01 DIAGNOSIS — O99342 Other mental disorders complicating pregnancy, second trimester: Secondary | ICD-10-CM

## 2020-03-01 DIAGNOSIS — F410 Panic disorder [episodic paroxysmal anxiety] without agoraphobia: Secondary | ICD-10-CM

## 2020-03-01 DIAGNOSIS — Z3A16 16 weeks gestation of pregnancy: Secondary | ICD-10-CM

## 2020-03-01 DIAGNOSIS — Z8679 Personal history of other diseases of the circulatory system: Secondary | ICD-10-CM

## 2020-03-01 DIAGNOSIS — O09899 Supervision of other high risk pregnancies, unspecified trimester: Secondary | ICD-10-CM

## 2020-03-01 DIAGNOSIS — O99412 Diseases of the circulatory system complicating pregnancy, second trimester: Secondary | ICD-10-CM

## 2020-03-01 NOTE — Progress Notes (Signed)
INITIAL PRENATAL VISIT NOTE  Subjective:  Catherine Carpenter is a 34 y.o. [id number] at [redacted]w[redacted]d by sure LMP being seen today for her initial prenatal visit. This is a unplanned pregnancy. She and partner are happy with the pregnancy. She was using nothing for birth control previously. She has an obstetric history significant for hx of LEEP, hx of delivery at 35 weeks. She has a medical history significant for paroxysmal SVT.  Patient reports no complaints.  Contractions: Not present. Vag. Bleeding: None.  Movement: Present. Denies leaking of fluid.    Past Medical History:  Diagnosis Date  . History of rheumatic fever as a child   . Panic disorder   . Paroxysmal SVT (supraventricular tachycardia) (HCC)   . Postpartum depression   . Preterm labor   . Vaginal Pap smear, abnormal    LEEP 2014    Past Surgical History:  Procedure Laterality Date  . CARDIAC ELECTROPHYSIOLOGY STUDY AND ABLATION    . COLPOSCOPY    . INDUCED ABORTION    . LEEP  2014  . WISDOM TOOTH EXTRACTION      OB History  Gravida Para Term Preterm AB Living  4 2 1 1 1 2   SAB TAB Ectopic Multiple Live Births    1   0 2    # Outcome Date GA Lbr Len/2nd Weight Sex Delivery Anes PTL Lv  4 Current           3 Term 05/21/19 [redacted]w[redacted]d 09:35 / 00:07 6 lb 3.3 oz (2.815 kg) F Vag-Spont None  LIV     Birth Comments: wnl  2 TAB 2018          1 Preterm 05/13/05 [redacted]w[redacted]d  5 lb 5 oz (2.41 kg) F Vag-Spont None Y LIV     Birth Comments: Had PTL; had hyperemesis gravidarum    Social History   Socioeconomic History  . Marital status: Single    Spouse name: Not on file  . Number of children: Not on file  . Years of education: Not on file  . Highest education level: Not on file  Occupational History    Comment: unemployed  Tobacco Use  . Smoking status: Former Smoker    Packs/day: 0.50    Types: Cigarettes    Quit date: 08/04/2018    Years since quitting: 1.5  . Smokeless tobacco: Never Used  Vaping Use  . Vaping Use: Some days   . Substances: Flavoring  . Devices: Vuse   Substance and Sexual Activity  . Alcohol use: Not Currently    Alcohol/week: 4.0 standard drinks    Types: 4 Glasses of wine per week    Comment: socially  . Drug use: Never  . Sexual activity: Yes    Partners: Male    Birth control/protection: None  Other Topics Concern  . Not on file  Social History Narrative  . Not on file   Social Determinants of Health   Financial Resource Strain: Low Risk   . Difficulty of Paying Living Expenses: Not hard at all  Food Insecurity: No Food Insecurity  . Worried About 14/09/2017 in the Last Year: Never true  . Ran Out of Food in the Last Year: Never true  Transportation Needs: No Transportation Needs  . Lack of Transportation (Medical): No  . Lack of Transportation (Non-Medical): No  Physical Activity:   . Days of Exercise per Week:   . Minutes of Exercise per Session:   Stress:   .  Feeling of Stress :   Social Connections:   . Frequency of Communication with Friends and Family:   . Frequency of Social Gatherings with Friends and Family:   . Attends Religious Services:   . Active Member of Clubs or Organizations:   . Attends Archivist Meetings:   Marland Kitchen Marital Status:     Family History  Problem Relation Age of Onset  . Thyroid disease Mother   . Depression Mother   . Diabetes Mother        Hypoglycemia  . Depression Father   . Hypertension Father   . Hyperlipidemia Father   . Depression Sister   . Anxiety disorder Sister   . Epilepsy Sister   . Fibroids Sister   . Seizures Sister   . Fibromyalgia Sister   . Anxiety disorder Brother   . Depression Brother   . Depression Brother   . Anxiety disorder Brother   . Anxiety disorder Brother   . Depression Brother   . Diabetes Paternal Grandmother      Current Outpatient Medications:  .  acetaminophen (TYLENOL) 325 MG tablet, Take 2 tablets (650 mg total) by mouth every 4 (four) hours as needed (for pain scale <  4)., Disp: 30 tablet, Rfl: 0 .  calcium carbonate (TUMS - DOSED IN MG ELEMENTAL CALCIUM) 500 MG chewable tablet, Chew 2 tablets by mouth as needed for indigestion or heartburn., Disp: , Rfl:  .  citalopram (CELEXA) 20 MG tablet, citalopram 20 mg tablet  TAKE 1 TABLET BY MOUTH EVERY DAY, Disp: , Rfl:  .  clonazePAM (KLONOPIN) 1 MG tablet, TAKE 1 TABLET BY MOUTH 2 TIMES DAILY AS NEEDED FOR ANXIETY., Disp: 60 tablet, Rfl: 0 .  diphenhydrAMINE (BENADRYL) 25 MG tablet, Take 25 mg by mouth at bedtime as needed for sleep. , Disp: , Rfl:  .  diphenhydramine-acetaminophen (TYLENOL PM) 25-500 MG TABS tablet, Take 1 tablet by mouth at bedtime as needed., Disp: , Rfl:  .  Prenatal Vit-Fe Fumarate-FA (MULTIVITAMIN-PRENATAL) 27-0.8 MG TABS tablet, Take 1 tablet by mouth daily at 12 noon., Disp: , Rfl:   Allergies  Allergen Reactions  . Bee Venom Anaphylaxis and Hives  . Penicillins Hives    Did it involve swelling of the face/tongue/throat, SOB, or low BP? No Did it involve sudden or severe rash/hives, skin peeling, or any reaction on the inside of your mouth or nose? No Did you need to seek medical attention at a hospital or doctor's office? No When did it last happen? If all above answers are "NO", may proceed with cephalosporin use.    Review of Systems: Negative except for what is mentioned in HPI.  Objective:   Vitals:   03/01/20 1500  BP: 107/69  Pulse: 98  Weight: 144 lb 12.8 oz (65.7 kg)    Fetal Status: Fetal Heart Rate (bpm): 144   Movement: Present     Physical Exam: BP 107/69   Pulse 98   Wt 144 lb 12.8 oz (65.7 kg)   LMP 11/10/2019   BMI 22.68 kg/m  CONSTITUTIONAL: Well-developed, well-nourished female in no acute distress.  NEUROLOGIC: Alert and oriented to person, place, and time. Normal reflexes, muscle tone coordination. No cranial nerve deficit noted. PSYCHIATRIC: Normal mood and affect. Normal behavior. Normal judgment and thought content. SKIN: Skin is warm and  dry. No rash noted. Not diaphoretic. No erythema. No pallor. HENT:  Normocephalic, atraumatic, External right and left ear normal. Oropharynx is clear and moist EYES: Conjunctivae and  EOM are normal. Pupils are equal, round, and reactive to light. No scleral icterus.  NECK: Normal range of motion, supple, no masses CARDIOVASCULAR: Normal heart rate noted, regular rhythm RESPIRATORY: Effort and breath sounds normal, no problems with respiration noted BREASTS: symmetric, non-tender, no masses palpable ABDOMEN: Soft, nontender, nondistended, gravid. GU: normal appearing external female genitalia, multiparous, normal appearing cervix, scant white discharge in vagina, no lesions noted Bimanual: 16 weeks sized uterus, no adnexal tenderness or palpable lesions noted MUSCULOSKELETAL: Normal range of motion. EXT:  No edema and no tenderness. 2+ distal pulses.   Assessment and Plan:  Pregnancy: [id number] at [redacted]w[redacted]d by sureLMP  1. Supervision of high risk pregnancy, antepartum  - Culture, OB Urine - Cervicovaginal ancillary only( [place])  2. Paroxysmal SVT (supraventricular tachycardia) (HCC) No symptoms currently, will contact cardiologist if she becomes symptomatic  3. Panic disorder Controlled with celexa and klonopin  4. History of preterm delivery, currently pregnant Delivered at 35 weeks, would hold makena for now, was term with last pregnancy  5. History of loop electrical excision procedure (LEEP) Check cervical length at anatomy scan, no hx of incompetent cervix  6. History of rheumatic fever as a child    Preterm labor symptoms and general obstetric precautions including but not limited to vaginal bleeding, contractions, leaking of fluid and fetal movement were reviewed in detail with the patient.  Please refer to After Visit Summary for other counseling recommendations.   Return in about 3 weeks (around 03/22/2020) for ROB.  Warden Fillers 03/01/2020 5:26 PM

## 2020-03-01 NOTE — Patient Instructions (Signed)

## 2020-03-02 ENCOUNTER — Telehealth: Payer: Self-pay

## 2020-03-02 LAB — CERVICOVAGINAL ANCILLARY ONLY
Bacterial Vaginitis (gardnerella): NEGATIVE
Candida Glabrata: NEGATIVE
Candida Vaginitis: POSITIVE — AB
Chlamydia: NEGATIVE
Comment: NEGATIVE
Comment: NEGATIVE
Comment: NEGATIVE
Comment: NEGATIVE
Comment: NEGATIVE
Comment: NORMAL
Neisseria Gonorrhea: NEGATIVE
Trichomonas: NEGATIVE

## 2020-03-02 MED ORDER — TERCONAZOLE 0.4 % VA CREA: 1.0000 | TOPICAL_CREAM | Freq: Every day | VAGINAL | 0 refills | Status: DC

## 2020-03-02 NOTE — Telephone Encounter (Addendum)
-----   Message from Warden Fillers, MD sent at 03/02/2020  4:24 PM EDT ----- Gonorrhea/chlamydia WNL  Candida noted.  Will treat with terazol cream  Notified pt that she has a yeast infection and that we have prescribed Terazol cream that she use at bedtime for seven days.  Pt verbalized understanding.   Addison Naegeli, RN

## 2020-03-03 LAB — URINE CULTURE, OB REFLEX

## 2020-03-03 LAB — CULTURE, OB URINE

## 2020-03-09 ENCOUNTER — Encounter: Payer: Self-pay | Admitting: General Practice

## 2020-03-22 ENCOUNTER — Other Ambulatory Visit: Payer: Self-pay | Admitting: General Practice

## 2020-03-22 ENCOUNTER — Other Ambulatory Visit: Payer: Self-pay | Admitting: *Deleted

## 2020-03-22 ENCOUNTER — Ambulatory Visit: Payer: Medicaid Other | Admitting: *Deleted

## 2020-03-22 ENCOUNTER — Encounter: Payer: Self-pay | Admitting: *Deleted

## 2020-03-22 ENCOUNTER — Other Ambulatory Visit: Payer: Medicaid Other

## 2020-03-22 ENCOUNTER — Encounter: Payer: Medicaid Other | Admitting: Obstetrics and Gynecology

## 2020-03-22 ENCOUNTER — Other Ambulatory Visit: Payer: Self-pay | Admitting: Obstetrics and Gynecology

## 2020-03-22 ENCOUNTER — Other Ambulatory Visit: Payer: Self-pay

## 2020-03-22 ENCOUNTER — Ambulatory Visit: Payer: Medicaid Other | Attending: Obstetrics and Gynecology

## 2020-03-22 DIAGNOSIS — O09899 Supervision of other high risk pregnancies, unspecified trimester: Secondary | ICD-10-CM

## 2020-03-22 DIAGNOSIS — O09892 Supervision of other high risk pregnancies, second trimester: Secondary | ICD-10-CM | POA: Diagnosis not present

## 2020-03-22 DIAGNOSIS — O0992 Supervision of high risk pregnancy, unspecified, second trimester: Secondary | ICD-10-CM

## 2020-03-22 DIAGNOSIS — O4442 Low lying placenta NOS or without hemorrhage, second trimester: Secondary | ICD-10-CM

## 2020-03-22 DIAGNOSIS — O99891 Other specified diseases and conditions complicating pregnancy: Secondary | ICD-10-CM | POA: Insufficient documentation

## 2020-03-22 DIAGNOSIS — Z3A19 19 weeks gestation of pregnancy: Secondary | ICD-10-CM | POA: Diagnosis not present

## 2020-03-22 DIAGNOSIS — O0990 Supervision of high risk pregnancy, unspecified, unspecified trimester: Secondary | ICD-10-CM

## 2020-03-22 DIAGNOSIS — Z8659 Personal history of other mental and behavioral disorders: Secondary | ICD-10-CM | POA: Diagnosis present

## 2020-03-22 DIAGNOSIS — O4440 Low lying placenta NOS or without hemorrhage, unspecified trimester: Secondary | ICD-10-CM

## 2020-03-22 DIAGNOSIS — Z363 Encounter for antenatal screening for malformations: Secondary | ICD-10-CM

## 2020-03-22 DIAGNOSIS — O09212 Supervision of pregnancy with history of pre-term labor, second trimester: Secondary | ICD-10-CM | POA: Diagnosis not present

## 2020-03-22 DIAGNOSIS — O3442 Maternal care for other abnormalities of cervix, second trimester: Secondary | ICD-10-CM | POA: Diagnosis not present

## 2020-03-22 DIAGNOSIS — I471 Supraventricular tachycardia: Secondary | ICD-10-CM

## 2020-03-29 ENCOUNTER — Encounter: Payer: Medicaid Other | Admitting: Obstetrics and Gynecology

## 2020-03-29 ENCOUNTER — Other Ambulatory Visit: Payer: Medicaid Other

## 2020-04-05 ENCOUNTER — Encounter: Payer: Medicaid Other | Admitting: Obstetrics and Gynecology

## 2020-04-05 ENCOUNTER — Other Ambulatory Visit: Payer: Medicaid Other

## 2020-04-19 ENCOUNTER — Ambulatory Visit: Payer: Medicaid Other

## 2020-04-19 ENCOUNTER — Ambulatory Visit (INDEPENDENT_AMBULATORY_CARE_PROVIDER_SITE_OTHER): Payer: Medicaid Other | Admitting: Obstetrics and Gynecology

## 2020-04-19 ENCOUNTER — Ambulatory Visit: Payer: Medicaid - Out of State | Attending: Obstetrics and Gynecology | Admitting: *Deleted

## 2020-04-19 ENCOUNTER — Encounter: Payer: Self-pay | Admitting: *Deleted

## 2020-04-19 ENCOUNTER — Ambulatory Visit (HOSPITAL_BASED_OUTPATIENT_CLINIC_OR_DEPARTMENT_OTHER): Payer: Medicaid - Out of State

## 2020-04-19 ENCOUNTER — Other Ambulatory Visit: Payer: Self-pay

## 2020-04-19 VITALS — BP 105/58 | HR 89 | Temp 99.2°F | Wt 156.0 lb

## 2020-04-19 DIAGNOSIS — O4440 Low lying placenta NOS or without hemorrhage, unspecified trimester: Secondary | ICD-10-CM | POA: Diagnosis not present

## 2020-04-19 DIAGNOSIS — Z362 Encounter for other antenatal screening follow-up: Secondary | ICD-10-CM | POA: Diagnosis not present

## 2020-04-19 DIAGNOSIS — M549 Dorsalgia, unspecified: Secondary | ICD-10-CM

## 2020-04-19 DIAGNOSIS — O99891 Other specified diseases and conditions complicating pregnancy: Secondary | ICD-10-CM

## 2020-04-19 DIAGNOSIS — Z8679 Personal history of other diseases of the circulatory system: Secondary | ICD-10-CM

## 2020-04-19 DIAGNOSIS — O09212 Supervision of pregnancy with history of pre-term labor, second trimester: Secondary | ICD-10-CM | POA: Diagnosis not present

## 2020-04-19 DIAGNOSIS — Z8659 Personal history of other mental and behavioral disorders: Secondary | ICD-10-CM

## 2020-04-19 DIAGNOSIS — O3440 Maternal care for other abnormalities of cervix, unspecified trimester: Secondary | ICD-10-CM | POA: Insufficient documentation

## 2020-04-19 DIAGNOSIS — O3442 Maternal care for other abnormalities of cervix, second trimester: Secondary | ICD-10-CM

## 2020-04-19 DIAGNOSIS — O0990 Supervision of high risk pregnancy, unspecified, unspecified trimester: Secondary | ICD-10-CM

## 2020-04-19 DIAGNOSIS — Z3A23 23 weeks gestation of pregnancy: Secondary | ICD-10-CM | POA: Diagnosis not present

## 2020-04-19 DIAGNOSIS — I471 Supraventricular tachycardia: Secondary | ICD-10-CM

## 2020-04-19 DIAGNOSIS — O4442 Low lying placenta NOS or without hemorrhage, second trimester: Secondary | ICD-10-CM | POA: Diagnosis not present

## 2020-04-19 DIAGNOSIS — F410 Panic disorder [episodic paroxysmal anxiety] without agoraphobia: Secondary | ICD-10-CM

## 2020-04-19 DIAGNOSIS — O09899 Supervision of other high risk pregnancies, unspecified trimester: Secondary | ICD-10-CM

## 2020-04-19 DIAGNOSIS — Z9889 Other specified postprocedural states: Secondary | ICD-10-CM

## 2020-04-19 DIAGNOSIS — O09293 Supervision of pregnancy with other poor reproductive or obstetric history, third trimester: Secondary | ICD-10-CM | POA: Insufficient documentation

## 2020-04-19 MED ORDER — COMFORT FIT MATERNITY SUPP MED MISC: 1.0000 | Freq: Every day | 0 refills | Status: DC | PRN

## 2020-04-19 NOTE — Progress Notes (Signed)
   PRENATAL VISIT NOTE  Subjective:  Catherine Carpenter is a 34 y.o. [phone] at [redacted]w[redacted]d being seen today for ongoing prenatal care.  She is currently monitored for the following issues for this high-risk pregnancy and has Paroxysmal SVT (supraventricular tachycardia) (HCC); Panic disorder; History of rheumatic fever as a child; Rheumatic fever; History of loop electrical excision procedure (LEEP); Counseling for birth control, oral contraceptives; Postpartum exam; Supervision of high risk pregnancy, antepartum; History of preterm delivery, currently pregnant; History of postpartum depression, currently pregnant; and [redacted] weeks gestation of pregnancy on their problem list.  Patient doing well with no acute concerns today. She reports no complaints.  Contractions: Not present. Vag. Bleeding: None.  Movement: Present. Denies leaking of fluid.   The following portions of the patient's history were reviewed and updated as appropriate: allergies, current medications, past family history, past medical history, past social history, past surgical history and problem list. Problem list updated.  Objective:   Vitals:   04/19/20 1502  BP: (!) 105/58  Pulse: 89  Temp: 99.2 F (37.3 C)  Weight: 156 lb (70.8 kg)    Fetal Status: Fetal Heart Rate (bpm): 154 Fundal Height: 23 cm Movement: Present     General:  Alert, oriented and cooperative. Patient is in no acute distress.  Skin: Skin is warm and dry. No rash noted.   Cardiovascular: Normal heart rate noted  Respiratory: Normal respiratory effort, no problems with respiration noted  Abdomen: Soft, gravid, appropriate for gestational age.  Pain/Pressure: Present     Pelvic: Cervical exam deferred        Extremities: Normal range of motion.  Edema: None  Mental Status:  Normal mood and affect. Normal behavior. Normal judgment and thought content.   Assessment and Plan:  Pregnancy: [id number] at [redacted]w[redacted]d  1. Paroxysmal SVT (supraventricular tachycardia) (HCC) No  issues  2. Panic disorder   3. History of rheumatic fever as a child   4. History of loop electrical excision procedure (LEEP) Cervical length seen 3.4 cm  5. Supervision of high risk pregnancy, antepartum  - Hepatitis C Antibody AFP drawn today  6. History of preterm delivery, currently pregnant No s/sx of preterm labor  7. [redacted] weeks gestation of pregnancy F/u in 4 weeks with 2 hour GTT  8. Low lying placenta:  Monitored by MFM scan ntoday  Preterm labor symptoms and general obstetric precautions including but not limited to vaginal bleeding, contractions, leaking of fluid and fetal movement were reviewed in detail with the patient.  Please refer to After Visit Summary for other counseling recommendations.   Return in about 4 weeks (around 05/17/2020) for [facility], in person, 3rd trim labs, 2 hr GTT.   Mariel Aloe, MD

## 2020-04-19 NOTE — Patient Instructions (Addendum)
Second Trimester of Pregnancy  The second trimester is from week 14 through week 27 (month 4 through 6). This is often the time in pregnancy that you feel your best. Often times, morning sickness has lessened or quit. You may have more energy, and you may get hungry more often. Your unborn baby is growing rapidly. At the end of the sixth month, he or she is about 9 inches long and weighs about 1 pounds. You will likely feel the baby move between 18 and 20 weeks of pregnancy. Follow these instructions at home: Medicines  Take over-the-counter and prescription medicines only as told by your doctor. Some medicines are safe and some medicines are not safe during pregnancy.  Take a prenatal vitamin that contains at least 600 micrograms (mcg) of folic acid.  If you have trouble pooping (constipation), take medicine that will make your stool soft (stool softener) if your doctor approves. Eating and drinking   Eat regular, healthy meals.  Avoid raw meat and uncooked cheese.  If you get low calcium from the food you eat, talk to your doctor about taking a daily calcium supplement.  Avoid foods that are high in fat and sugars, such as fried and sweet foods.  If you feel sick to your stomach (nauseous) or throw up (vomit): ? Eat 4 or 5 small meals a day instead of 3 large meals. ? Try eating a few soda crackers. ? Drink liquids between meals instead of during meals.  To prevent constipation: ? Eat foods that are high in fiber, like fresh fruits and vegetables, whole grains, and beans. ? Drink enough fluids to keep your pee (urine) clear or pale yellow. Activity  Exercise only as told by your doctor. Stop exercising if you start to have cramps.  Do not exercise if it is too hot, too humid, or if you are in a place of great height (high altitude).  Avoid heavy lifting.  Wear low-heeled shoes. Sit and stand up straight.  You can continue to have sex unless your doctor tells you not  to. Relieving pain and discomfort  Wear a good support bra if your breasts are tender.  Take warm water baths (sitz baths) to soothe pain or discomfort caused by hemorrhoids. Use hemorrhoid cream if your doctor approves.  Rest with your legs raised if you have leg cramps or low back pain.  If you develop puffy, bulging veins (varicose veins) in your legs: ? Wear support hose or compression stockings as told by your doctor. ? Raise (elevate) your feet for 15 minutes, 3-4 times a day. ? Limit salt in your food. Prenatal care  Write down your questions. Take them to your prenatal visits.  Keep all your prenatal visits as told by your doctor. This is important. Safety  Wear your seat belt when driving.  Make a list of emergency phone numbers, including numbers for family, friends, the hospital, and police and fire departments. General instructions  Ask your doctor about the right foods to eat or for help finding a counselor, if you need these services.  Ask your doctor about local prenatal classes. Begin classes before month 6 of your pregnancy.  Do not use hot tubs, steam rooms, or saunas.  Do not douche or use tampons or scented sanitary pads.  Do not cross your legs for long periods of time.  Visit your dentist if you have not done so. Use a soft toothbrush to brush your teeth. Floss gently.  Avoid all smoking, herbs,   and alcohol. Avoid drugs that are not approved by your doctor.  Do not use any products that contain nicotine or tobacco, such as cigarettes and e-cigarettes. If you need help quitting, ask your doctor.  Avoid cat litter boxes and soil used by cats. These carry germs that can cause birth defects in the baby and can cause a loss of your baby (miscarriage) or stillbirth. Contact a doctor if:  You have mild cramps or pressure in your lower belly.  You have pain when you pee (urinate).  You have bad smelling fluid coming from your vagina.  You continue to  feel sick to your stomach (nauseous), throw up (vomit), or have watery poop (diarrhea).  You have a nagging pain in your belly area.  You feel dizzy. Get help right away if:  You have a fever.  You are leaking fluid from your vagina.  You have spotting or bleeding from your vagina.  You have severe belly cramping or pain.  You lose or gain weight rapidly.  You have trouble catching your breath and have chest pain.  You notice sudden or extreme puffiness (swelling) of your face, hands, ankles, feet, or legs.  You have not felt the baby move in over an hour.  You have severe headaches that do not go away when you take medicine.  You have trouble seeing. Summary  The second trimester is from week 14 through week 27 (months 4 through 6). This is often the time in pregnancy that you feel your best.  To take care of yourself and your unborn baby, you will need to eat healthy meals, take medicines only if your doctor tells you to do so, and do activities that are safe for you and your baby.  Call your doctor if you get sick or if you notice anything unusual about your pregnancy. Also, call your doctor if you need help with the right food to eat, or if you want to know what activities are safe for you. This information is not intended to replace advice given to you by your health care provider. Make sure you discuss any questions you have with your health care provider. Document Revised: 12/13/2018 Document Reviewed: 09/26/2016 Elsevier Patient Education  2020 [employer] Inc[employer]     PREGNANCY SUPPORT BELT: You are not alone, Seventy-five percent of women have some sort of abdominal or back pain at some point in their pregnancy. Your baby is growing at a fast pace, which means that your whole body is rapidly trying to adjust to the changes. As your uterus grows, your back may start feeling a bit under stress and this can result in back or abdominal pain that can go from mild, and  therefore bearable, to severe pains that will not allow you to sit or lay down comfortably, When it comes to dealing with pregnancy-related pains and cramps, some pregnant women usually prefer natural remedies, which the market is filled with nowadays. For example, wearing a pregnancy support belt can help ease and lessen your discomfort and pain. WHAT ARE THE BENEFITS OF WEARING A PREGNANCY SUPPORT BELT? A pregnancy support belt provides support to the lower portion of the belly taking some of the weight of the growing uterus and distributing to the other parts of your body. It is designed make you comfortable and gives you extra support. Over the years, the pregnancy apparel market has been studying the needs and wants of pregnant women and they have come up with the most comfortable pregnancy  support belts that woman could ever ask for. In fact, you will no longer have to wear a stretched-out or bulky pregnancy belt that is visible underneath your clothes and makes you feel even more uncomfortable. Nowadays, a pregnancy support belt is made of comfortable and stretchy materials that will not irritate your skin but will actually make you feel at ease and you will not even notice you are wearing it. They are easy to put on and adjust during the day and can be worn at night for additional support.  BENEFITS: . Relives Back pain . Relieves Abdominal Muscle and Leg Pain . Stabilizes the Pelvic Ring . Offers a Cushioned Abdominal Lift Pad . Relieves pressure on the Sciatic Nerve Within Minutes WHERE TO GET YOUR PREGNANCY BELT: [employer] [phone] @[address], [place]

## 2020-04-21 ENCOUNTER — Other Ambulatory Visit: Payer: Self-pay

## 2020-04-21 ENCOUNTER — Other Ambulatory Visit: Payer: Medicaid Other

## 2020-04-22 LAB — AFP, SERUM, OPEN SPINA BIFIDA
AFP MoM: 2.64
AFP Value: 230.8 ng/mL
Gest. Age on Collection Date: 23.3 weeks
Maternal Age At EDD: 32.5 yr
OSBR Risk 1 IN: 221
Test Results:: POSITIVE — AB
Weight: 156 [lb_av]

## 2020-04-22 LAB — HEPATITIS C ANTIBODY: Hep C Virus Ab: <0.1 s/co ratio (ref 0.0–0.9)

## 2020-04-23 ENCOUNTER — Telehealth (INDEPENDENT_AMBULATORY_CARE_PROVIDER_SITE_OTHER): Payer: Medicaid Other | Admitting: Family Medicine

## 2020-04-23 ENCOUNTER — Telehealth: Payer: Self-pay

## 2020-04-23 DIAGNOSIS — R772 Abnormality of alphafetoprotein: Secondary | ICD-10-CM

## 2020-04-23 NOTE — Addendum Note (Signed)
Addended by: Kathee Delton on: 04/23/2020 10:52 AM   Modules accepted: Orders

## 2020-04-23 NOTE — Telephone Encounter (Signed)
Pt called concerning AFP results,advised that the test showed that she is at an increased risk to have a baby with Open Spina Bifida, not that they baby has it. Advised will forward results to Provider for Pt. contact.

## 2020-04-23 NOTE — Telephone Encounter (Signed)
Patient called into the office wanting to speak to someone about her lab results for the baby. Patient stated that the results showed that the baby has spina bifida and wants to know more details. Patient would like to speak to someone today about this matter. Patient informed that I would send over the message to the nurses and they will contact her as soon as they can. Patient verbalized understanding and message sent to clinical pool.

## 2020-04-23 NOTE — Telephone Encounter (Signed)
Called patient and reviewed with her AFP results discussing test is a screening tool & not diagnostic, reassurance that previous U/S results are normal, & NIPS was normal as well.  Discussed recommended genetic counseling appt with MFM and scheduled for 8/23 @ 1245. Patient verbalized understanding to all.

## 2020-04-26 ENCOUNTER — Other Ambulatory Visit: Payer: Self-pay | Admitting: Obstetrics and Gynecology

## 2020-04-26 ENCOUNTER — Telehealth: Payer: Self-pay | Admitting: *Deleted

## 2020-04-26 ENCOUNTER — Encounter: Payer: Self-pay | Admitting: *Deleted

## 2020-04-26 DIAGNOSIS — O280 Abnormal hematological finding on antenatal screening of mother: Secondary | ICD-10-CM

## 2020-04-26 NOTE — Telephone Encounter (Addendum)
-----   Message from Warden Fillers, MD sent at 04/26/2020 11:18 AM EDT ----- AFP result discussed with Dr. Grace Bushy, MFM.  Pt had anatomy scan 2 days prior which was normal.  Dr. Grace Bushy recommended rescan for confirmation of normal anatomy and status.  Contact pt for scheduuling.  8/23  1150 Pt was notified of AFP results and that follow up US will be schedule. Pt voiced understanding and asked for appt to be on same Kavan Devan as next office appt (9/13) because she lives one hour away.

## 2020-05-17 ENCOUNTER — Other Ambulatory Visit: Payer: Medicaid Other

## 2020-05-17 ENCOUNTER — Ambulatory Visit (INDEPENDENT_AMBULATORY_CARE_PROVIDER_SITE_OTHER): Payer: Medicaid Other | Admitting: Obstetrics and Gynecology

## 2020-05-17 ENCOUNTER — Other Ambulatory Visit: Payer: Self-pay | Admitting: General Practice

## 2020-05-17 ENCOUNTER — Other Ambulatory Visit: Payer: Self-pay

## 2020-05-17 ENCOUNTER — Ambulatory Visit: Payer: Medicaid Other

## 2020-05-17 ENCOUNTER — Ambulatory Visit: Payer: Medicaid Other | Attending: Obstetrics and Gynecology

## 2020-05-17 ENCOUNTER — Other Ambulatory Visit: Payer: Self-pay | Admitting: *Deleted

## 2020-05-17 VITALS — BP 106/58 | HR 74 | Wt 163.8 lb

## 2020-05-17 DIAGNOSIS — O0990 Supervision of high risk pregnancy, unspecified, unspecified trimester: Secondary | ICD-10-CM

## 2020-05-17 DIAGNOSIS — O09899 Supervision of other high risk pregnancies, unspecified trimester: Secondary | ICD-10-CM

## 2020-05-17 DIAGNOSIS — O3442 Maternal care for other abnormalities of cervix, second trimester: Secondary | ICD-10-CM | POA: Diagnosis not present

## 2020-05-17 DIAGNOSIS — Z8659 Personal history of other mental and behavioral disorders: Secondary | ICD-10-CM | POA: Diagnosis present

## 2020-05-17 DIAGNOSIS — Z9889 Other specified postprocedural states: Secondary | ICD-10-CM

## 2020-05-17 DIAGNOSIS — O09212 Supervision of pregnancy with history of pre-term labor, second trimester: Secondary | ICD-10-CM | POA: Diagnosis not present

## 2020-05-17 DIAGNOSIS — Z362 Encounter for other antenatal screening follow-up: Secondary | ICD-10-CM | POA: Diagnosis not present

## 2020-05-17 DIAGNOSIS — O280 Abnormal hematological finding on antenatal screening of mother: Secondary | ICD-10-CM | POA: Diagnosis not present

## 2020-05-17 DIAGNOSIS — O99891 Other specified diseases and conditions complicating pregnancy: Secondary | ICD-10-CM | POA: Diagnosis present

## 2020-05-17 DIAGNOSIS — I471 Supraventricular tachycardia: Secondary | ICD-10-CM

## 2020-05-17 DIAGNOSIS — R772 Abnormality of alphafetoprotein: Secondary | ICD-10-CM

## 2020-05-17 DIAGNOSIS — Z8679 Personal history of other diseases of the circulatory system: Secondary | ICD-10-CM

## 2020-05-17 DIAGNOSIS — F410 Panic disorder [episodic paroxysmal anxiety] without agoraphobia: Secondary | ICD-10-CM

## 2020-05-17 DIAGNOSIS — Z3A27 27 weeks gestation of pregnancy: Secondary | ICD-10-CM

## 2020-05-17 DIAGNOSIS — O4442 Low lying placenta NOS or without hemorrhage, second trimester: Secondary | ICD-10-CM

## 2020-05-17 DIAGNOSIS — Z23 Encounter for immunization: Secondary | ICD-10-CM | POA: Diagnosis not present

## 2020-05-17 DIAGNOSIS — O289 Unspecified abnormal findings on antenatal screening of mother: Secondary | ICD-10-CM

## 2020-05-17 NOTE — Progress Notes (Signed)
Subjective:  Catherine Carpenter is a 34 y.o. [phone] at [redacted]w[redacted]d being seen today for ongoing prenatal care.  She is currently monitored for the following issues for this high-risk pregnancy and has Paroxysmal SVT (supraventricular tachycardia) (HCC); Panic disorder; History of rheumatic fever as a child; Rheumatic fever; History of loop electrical excision procedure (LEEP); Supervision of high risk pregnancy, antepartum; History of preterm delivery, currently pregnant; and History of postpartum depression, currently pregnant on their problem list.  Patient reports no complaints.  Contractions: Not present. Vag. Bleeding: None.  Movement: Present. Denies leaking of fluid.   The following portions of the patient's history were reviewed and updated as appropriate: allergies, current medications, past family history, past medical history, past social history, past surgical history and problem list. Problem list updated.  Objective:   Vitals:   05/17/20 0836  BP: (!) 106/58  Pulse: 74  Weight: 74.3 kg    Fetal Status: Fetal Heart Rate (bpm): 145   Movement: Present     General:  Alert, oriented and cooperative. Patient is in no acute distress.  Skin: Skin is warm and dry. No rash noted.   Cardiovascular: Normal heart rate noted  Respiratory: Normal respiratory effort, no problems with respiration noted  Abdomen: Soft, gravid, appropriate for gestational age. Pain/Pressure: Present     Pelvic:  Cervical exam deferred        Extremities: Normal range of motion.  Edema: None  Mental Status: Normal mood and affect. Normal behavior. Normal judgment and thought content.   Urinalysis:      Assessment and Plan:  Pregnancy: [id number] at [redacted]w[redacted]d  1. Supervision of high risk pregnancy, antepartum Stable Low lying placenta resolved on last U/S Growth scan today - Hepatitis C Antibody - Tdap vaccine greater than or equal to 7yo IM - Flu Vaccine QUAD 36+ mos IM  2. History of preterm delivery, currently  pregnant Stable No S/Sx of PTL  3. History of loop electrical excision procedure (LEEP) Normal CL at anatomy scan  4. History of rheumatic fever as a child   5. Panic disorder Stable on current medical treatment  6. Paroxysmal SVT (supraventricular tachycardia) (HCC) Stable  Preterm labor symptoms and general obstetric precautions including but not limited to vaginal bleeding, contractions, leaking of fluid and fetal movement were reviewed in detail with the patient. Please refer to After Visit Summary for other counseling recommendations.  Return in about 2 weeks (around 05/31/2020) for OB visit, virtual, MD provider.   Hermina Staggers, MD

## 2020-05-17 NOTE — Patient Instructions (Signed)
Third Trimester of Pregnancy The third trimester is from week 28 through week 40 (months 7 through 9). The third trimester is a time when the unborn baby (fetus) is growing rapidly. At the end of the ninth month, the fetus is about 20 inches in length and weighs 6-10 pounds. Body changes during your third trimester Your body will continue to go through many changes during pregnancy. The changes vary from woman to woman. During the third trimester:  Your weight will continue to increase. You can expect to gain 25-35 pounds (11-16 kg) by the end of the pregnancy.  You may begin to get stretch marks on your hips, abdomen, and breasts.  You may urinate more often because the fetus is moving lower into your pelvis and pressing on your bladder.  You may develop or continue to have heartburn. This is caused by increased hormones that slow down muscles in the digestive tract.  You may develop or continue to have constipation because increased hormones slow digestion and cause the muscles that push waste through your intestines to relax.  You may develop hemorrhoids. These are swollen veins (varicose veins) in the rectum that can itch or be painful.  You may develop swollen, bulging veins (varicose veins) in your legs.  You may have increased body aches in the pelvis, back, or thighs. This is due to weight gain and increased hormones that are relaxing your joints.  You may have changes in your hair. These can include thickening of your hair, rapid growth, and changes in texture. Some women also have hair loss during or after pregnancy, or hair that feels dry or thin. Your hair will most likely return to normal after your baby is born.  Your breasts will continue to grow and they will continue to become tender. A yellow fluid (colostrum) may leak from your breasts. This is the first milk you are producing for your baby.  Your belly button may stick out.  You may notice more swelling in your hands,  face, or ankles.  You may have increased tingling or numbness in your hands, arms, and legs. The skin on your belly may also feel numb.  You may feel short of breath because of your expanding uterus.  You may have more problems sleeping. This can be caused by the size of your belly, increased need to urinate, and an increase in your body's metabolism.  You may notice the fetus "dropping," or moving lower in your abdomen (lightening).  You may have increased vaginal discharge.  You may notice your joints feel loose and you may have pain around your pelvic bone. What to expect at prenatal visits You will have prenatal exams every 2 weeks until week 36. Then you will have weekly prenatal exams. During a routine prenatal visit:  You will be weighed to make sure you and the baby are growing normally.  Your blood pressure will be taken.  Your abdomen will be measured to track your baby's growth.  The fetal heartbeat will be listened to.  Any test results from the previous visit will be discussed.  You may have a cervical check near your due date to see if your cervix has softened or thinned (effaced).  You will be tested for Group B streptococcus. This happens between 35 and 37 weeks. Your health care provider may ask you:  What your birth plan is.  How you are feeling.  If you are feeling the baby move.  If you have had any abnormal   symptoms, such as leaking fluid, bleeding, severe headaches, or abdominal cramping.  If you are using any tobacco products, including cigarettes, chewing tobacco, and electronic cigarettes.  If you have any questions. Other tests or screenings that may be performed during your third trimester include:  Blood tests that check for low iron levels (anemia).  Fetal testing to check the health, activity level, and growth of the fetus. Testing is done if you have certain medical conditions or if there are problems during the pregnancy.  Nonstress test  (NST). This test checks the health of your baby to make sure there are no signs of problems, such as the baby not getting enough oxygen. During this test, a belt is placed around your belly. The baby is made to move, and its heart rate is monitored during movement. What is false labor? False labor is a condition in which you feel small, irregular tightenings of the muscles in the womb (contractions) that usually go away with rest, changing position, or drinking water. These are called Braxton Hicks contractions. Contractions may last for hours, days, or even weeks before true labor sets in. If contractions come at regular intervals, become more frequent, increase in intensity, or become painful, you should see your health care provider. What are the signs of labor?  Abdominal cramps.  Regular contractions that start at 10 minutes apart and become stronger and more frequent with time.  Contractions that start on the top of the uterus and spread down to the lower abdomen and back.  Increased pelvic pressure and dull back pain.  A watery or bloody mucus discharge that comes from the vagina.  Leaking of amniotic fluid. This is also known as your "water breaking." It could be a slow trickle or a gush. Let your health care provider know if it has a color or strange odor. If you have any of these signs, call your health care provider right away, even if it is before your due date. Follow these instructions at home: Medicines  Follow your health care provider's instructions regarding medicine use. Specific medicines may be either safe or unsafe to take during pregnancy.  Take a prenatal vitamin that contains at least 600 micrograms (mcg) of folic acid.  If you develop constipation, try taking a stool softener if your health care provider approves. Eating and drinking   Eat a balanced diet that includes fresh fruits and vegetables, whole grains, good sources of protein such as meat, eggs, or tofu,  and low-fat dairy. Your health care provider will help you determine the amount of weight gain that is right for you.  Avoid raw meat and uncooked cheese. These carry germs that can cause birth defects in the baby.  If you have low calcium intake from food, talk to your health care provider about whether you should take a daily calcium supplement.  Eat four or five small meals rather than three large meals a day.  Limit foods that are high in fat and processed sugars, such as fried and sweet foods.  To prevent constipation: ? Drink enough fluid to keep your urine clear or pale yellow. ? Eat foods that are high in fiber, such as fresh fruits and vegetables, whole grains, and beans. Activity  Exercise only as directed by your health care provider. Most women can continue their usual exercise routine during pregnancy. Try to exercise for 30 minutes at least 5 days a week. Stop exercising if you experience uterine contractions.  Avoid heavy lifting.  Do   not exercise in extreme heat or humidity, or at high altitudes.  Wear low-heel, comfortable shoes.  Practice good posture.  You may continue to have sex unless your health care provider tells you otherwise. Relieving pain and discomfort  Take frequent breaks and rest with your legs elevated if you have leg cramps or low back pain.  Take warm sitz baths to soothe any pain or discomfort caused by hemorrhoids. Use hemorrhoid cream if your health care provider approves.  Wear a good support bra to prevent discomfort from breast tenderness.  If you develop varicose veins: ? Wear support pantyhose or compression stockings as told by your healthcare provider. ? Elevate your feet for 15 minutes, 3-4 times a day. Prenatal care  Write down your questions. Take them to your prenatal visits.  Keep all your prenatal visits as told by your health care provider. This is important. Safety  Wear your seat belt at all times when driving.  Make  a list of emergency phone numbers, including numbers for family, friends, the hospital, and police and fire departments. General instructions  Avoid cat litter boxes and soil used by cats. These carry germs that can cause birth defects in the baby. If you have a cat, ask someone to clean the litter box for you.  Do not travel far distances unless it is absolutely necessary and only with the approval of your health care provider.  Do not use hot tubs, steam rooms, or saunas.  Do not drink alcohol.  Do not use any products that contain nicotine or tobacco, such as cigarettes and e-cigarettes. If you need help quitting, ask your health care provider.  Do not use any medicinal herbs or unprescribed drugs. These chemicals affect the formation and growth of the baby.  Do not douche or use tampons or scented sanitary pads.  Do not cross your legs for long periods of time.  To prepare for the arrival of your baby: ? Take prenatal classes to understand, practice, and ask questions about labor and delivery. ? Make a trial run to the hospital. ? Visit the hospital and tour the maternity area. ? Arrange for maternity or paternity leave through employers. ? Arrange for family and friends to take care of pets while you are in the hospital. ? Purchase a rear-facing car seat and make sure you know how to install it in your car. ? Pack your hospital bag. ? Prepare the baby's nursery. Make sure to remove all pillows and stuffed animals from the baby's crib to prevent suffocation.  Visit your dentist if you have not gone during your pregnancy. Use a soft toothbrush to brush your teeth and be gentle when you floss. Contact a health care provider if:  You are unsure if you are in labor or if your water has broken.  You become dizzy.  You have mild pelvic cramps, pelvic pressure, or nagging pain in your abdominal area.  You have lower back pain.  You have persistent nausea, vomiting, or  diarrhea.  You have an unusual or bad smelling vaginal discharge.  You have pain when you urinate. Get help right away if:  Your water breaks before 37 weeks.  You have regular contractions less than 5 minutes apart before 37 weeks.  You have a fever.  You are leaking fluid from your vagina.  You have spotting or bleeding from your vagina.  You have severe abdominal pain or cramping.  You have rapid weight loss or weight gain.  You have   shortness of breath with chest pain.  You notice sudden or extreme swelling of your face, hands, ankles, feet, or legs.  Your baby makes fewer than 10 movements in 2 hours.  You have severe headaches that do not go away when you take medicine.  You have vision changes. Summary  The third trimester is from week 28 through week 40, months 7 through 9. The third trimester is a time when the unborn baby (fetus) is growing rapidly.  During the third trimester, your discomfort may increase as you and your baby continue to gain weight. You may have abdominal, leg, and back pain, sleeping problems, and an increased need to urinate.  During the third trimester your breasts will keep growing and they will continue to become tender. A yellow fluid (colostrum) may leak from your breasts. This is the first milk you are producing for your baby.  False labor is a condition in which you feel small, irregular tightenings of the muscles in the womb (contractions) that eventually go away. These are called Braxton Hicks contractions. Contractions may last for hours, days, or even weeks before true labor sets in.  Signs of labor can include: abdominal cramps; regular contractions that start at 10 minutes apart and become stronger and more frequent with time; watery or bloody mucus discharge that comes from the vagina; increased pelvic pressure and dull back pain; and leaking of amniotic fluid. This information is not intended to replace advice given to you by your  health care provider. Make sure you discuss any questions you have with your health care provider. Document Revised: 12/12/2018 Document Reviewed: 09/26/2016 Elsevier Patient Education  2020 [employer]  

## 2020-05-18 ENCOUNTER — Encounter: Payer: Self-pay | Admitting: *Deleted

## 2020-05-18 ENCOUNTER — Encounter: Payer: Medicaid Other | Admitting: Obstetrics and Gynecology

## 2020-05-18 ENCOUNTER — Other Ambulatory Visit: Payer: Medicaid Other

## 2020-05-18 LAB — HIV ANTIBODY (ROUTINE TESTING W REFLEX): HIV Screen 4th Generation wRfx: NONREACTIVE

## 2020-05-18 LAB — CBC
Hematocrit: 34.5 % (ref 34.0–46.6)
Hemoglobin: 12 g/dL (ref 11.1–15.9)
MCH: 33.2 pg — ABNORMAL HIGH (ref 26.6–33.0)
MCHC: 34.8 g/dL (ref 31.5–35.7)
MCV: 96 fL (ref 79–97)
Platelets: 229 10*3/uL (ref 150–450)
RBC: 3.61 x10E6/uL — ABNORMAL LOW (ref 3.77–5.28)
RDW: 11.6 % — ABNORMAL LOW (ref 11.7–15.4)
WBC: 7.9 10*3/uL (ref 3.4–10.8)

## 2020-05-18 LAB — GLUCOSE TOLERANCE, 2 HOURS W/ 1HR
Glucose, 1 hour: 158 mg/dL (ref 65–179)
Glucose, 2 hour: 133 mg/dL (ref 65–152)
Glucose, Fasting: 85 mg/dL (ref 65–91)

## 2020-05-18 LAB — RPR: RPR Ser Ql: NONREACTIVE

## 2020-06-03 ENCOUNTER — Encounter: Payer: Self-pay | Admitting: Obstetrics and Gynecology

## 2020-06-03 ENCOUNTER — Other Ambulatory Visit: Payer: Self-pay

## 2020-06-03 ENCOUNTER — Telehealth (INDEPENDENT_AMBULATORY_CARE_PROVIDER_SITE_OTHER): Payer: Medicaid Other | Admitting: Obstetrics and Gynecology

## 2020-06-03 VITALS — BP 97/57 | HR 77

## 2020-06-03 DIAGNOSIS — O09213 Supervision of pregnancy with history of pre-term labor, third trimester: Secondary | ICD-10-CM

## 2020-06-03 DIAGNOSIS — I471 Supraventricular tachycardia: Secondary | ICD-10-CM

## 2020-06-03 DIAGNOSIS — O99413 Diseases of the circulatory system complicating pregnancy, third trimester: Secondary | ICD-10-CM

## 2020-06-03 DIAGNOSIS — Z3A29 29 weeks gestation of pregnancy: Secondary | ICD-10-CM

## 2020-06-03 DIAGNOSIS — O09893 Supervision of other high risk pregnancies, third trimester: Secondary | ICD-10-CM

## 2020-06-03 DIAGNOSIS — O09899 Supervision of other high risk pregnancies, unspecified trimester: Secondary | ICD-10-CM

## 2020-06-03 DIAGNOSIS — O280 Abnormal hematological finding on antenatal screening of mother: Secondary | ICD-10-CM

## 2020-06-03 DIAGNOSIS — O0990 Supervision of high risk pregnancy, unspecified, unspecified trimester: Secondary | ICD-10-CM

## 2020-06-03 NOTE — Progress Notes (Signed)
OBSTETRICS PRENATAL VIRTUAL VISIT ENCOUNTER NOTE  Provider location: [facility]   I connected with Catherine Carpenter on 06/03/20 at  9:55 AM EDT by MyChart Video Encounter at home and verified that I am speaking with the correct person using two identifiers.   I discussed the limitations, risks, security and privacy concerns of performing an evaluation and management service virtually and the availability of in person appointments. I also discussed with the patient that there may be a patient responsible charge related to this service. The patient expressed understanding and agreed to proceed. Subjective:  Catherine Carpenter is a 34 y.o. [phone] at [redacted]w[redacted]d being seen today for ongoing prenatal care.  She is currently monitored for the following issues for this high-risk pregnancy and has Paroxysmal SVT (supraventricular tachycardia) (HCC); Panic disorder; History of rheumatic fever as a child; Rheumatic fever; History of loop electrical excision procedure (LEEP); Supervision of high risk pregnancy, antepartum; History of preterm delivery, currently pregnant; History of postpartum depression, currently pregnant; and Abnormal MSAFP (maternal serum alpha-fetoprotein), elevated on their problem list.  Patient reports no complaints.  Contractions: Not present. Vag. Bleeding: None.  Movement: Present. Denies any leaking of fluid.   The following portions of the patient's history were reviewed and updated as appropriate: allergies, current medications, past family history, past medical history, past social history, past surgical history and problem list.   Objective:   Vitals:   06/03/20 1009  BP: (!) 97/57  Pulse: 77    Fetal Status:     Movement: Present     General:  Alert, oriented and cooperative. Patient is in no acute distress.  Respiratory: Normal respiratory effort, no problems with respiration noted  Mental Status: Normal mood and affect. Normal  behavior. Normal judgment and thought content.  Rest of physical exam deferred due to type of encounter  Imaging: [place] MFM OB FOLLOW UP  Result Date: 05/17/2020 ----------------------------------------------------------------------  OBSTETRICS REPORT                       (Signed Final 05/17/2020 04:10 pm) ---------------------------------------------------------------------- Patient Info  ID #:       [id number]                          D.O.B.:  [birth year] (34 yrs)  Name:       Catherine Carpenter                   Visit Date: 05/17/2020 02:09 pm ---------------------------------------------------------------------- Performed By  Attending:        Ma Rings MD         Ref. Address:     [address]                                                             [place], [place]                                                             [address]  Performed By:  Sandi Mealy        [facility]  Referred By:      [facility] ---------------------------------------------------------------------- Orders  #  Description                           Code        Ordered By  1  [place] MFM OB FOLLOW UP                   [phone]    Mariel Aloe ----------------------------------------------------------------------  #  Order #                     Accession #                Episode #  1  [id number]                   [id number]                 [id number] ---------------------------------------------------------------------- Indications  Elevated MSAFP                                 O28.9  Previous cervical surgery (LEEP)               O34.40  [redacted] weeks gestation of pregnancy                Z3A.27  Low lying placenta, antepartum                 O44.40  Encounter for other  antenatal screening        Z36.2  follow-up  Poor obstetric history: Previous preterm       O09.219  delivery, antepartum (35 weeks) ---------------------------------------------------------------------- Fetal Evaluation  Num Of Fetuses:         1  Fetal Heart Rate(bpm):  128  Cardiac Activity:       Observed  Presentation:           Cephalic  Placenta:               Anterior  P. Cord Insertion:  Visualized  Amniotic Fluid  AFI FV:      Within normal limits                              Largest Pocket(cm)                              7.56 ---------------------------------------------------------------------- Biometry  BPD:        73  mm     G. Age:  29w 2d         96  %    CI:        75.56   %    70 - 86                                                          FL/HC:      19.0   %    18.6 - 20.4  HC:      266.3  mm     G. Age:  29w 0d         84  %    HC/AC:      1.11        1.05 - 1.21  AC:      239.8  mm     G. Age:  28w 2d         79  %    FL/BPD:     69.3   %    71 - 87  FL:       50.6  mm     G. Age:  27w 1d         39  %    FL/AC:      21.1   %    20 - 24  HUM:      45.8  mm     G. Age:  27w 0d         48  %  LV:        5.9  mm  Est. FW:    1163  gm      2 lb 9 oz     79  % ---------------------------------------------------------------------- OB History  Gravidity:    4         Term:   1        Prem:   1        SAB:   0  TOP:          1       Ectopic:  0        Living: 2 ---------------------------------------------------------------------- Gestational Age  LMP:           27w 0d        Date:  11/10/19                 EDD:   08/16/20  U/S Today:     28w 3d                                        EDD:   08/06/20  Best:  27w 0d     Det. By:  LMP  (11/10/19)          EDD:   08/16/20 ---------------------------------------------------------------------- Anatomy  Cranium:               Appears normal         Aortic Arch:            Previously seen  Cavum:                 Appears normal         Ductal  Arch:            Previously seen  Ventricles:            Appears normal         Diaphragm:              Appears normal  Choroid Plexus:        Previously seen        Stomach:                Appears normal, left                                                                        sided  Cerebellum:            Previously seen        Abdomen:                Appears normal  Posterior Fossa:       Previously seen        Abdominal Wall:         Previously seen  Nuchal Fold:           Not applicable (>20    Cord Vessels:           Previously seen                         wks GA)  Face:                  Orbits and profile     Kidneys:                Appear normal                         previously seen  Lips:                  Previously seen        Bladder:                Previously seen  Thoracic:              Appears normal         Spine:                  Previously seen  Heart:                 Previously seen        Upper Extremities:      Previously seen  RVOT:  Previously seen        Lower Extremities:      Previously seen  LVOT:                  Previously seen  Other:  Heels and 5th digit previously visualized. Open hands previously          visualized. Nasal bone visualized. Technically difficult due to fetal          position. ---------------------------------------------------------------------- Comments  This patient was referred back for another ultrasound exam  due to an elevated MSAFP of 2.64 MoM.  She reports no  complications in her current pregnancy.  There were no  obvious anomalies noted on her prior ultrasound exam.  The fetal growth and amniotic fluid level appeared  appropriate for her gestational age.  The patient was advised of the ultrasound findings that failed  to reveal an anatomical cause for the increased MSAFP.  There were no sonographic signs of spina bifida or an  anterior abdominal wall defect noted today.  She was advised  regarding the limitations of ultrasound in the  detection of all  anomalies and that it will diagnose approximately 90% of  neural tube defects.  The association of an elevated MSAFP  with placental dysfunction which may manifest later in her  pregnancy as fetal growth restriction was discussed.  Due to the elevated MSAFP, a follow-up growth scan was  scheduled in 5 weeks. ----------------------------------------------------------------------                   Ma Rings, MD Electronically Signed Final Report   05/17/2020 04:10 pm ----------------------------------------------------------------------   Assessment and Plan:  Pregnancy: [id number] at [redacted]w[redacted]d 1. Supervision of high risk pregnancy, antepartum Stable   2. Abnormal MSAFP (maternal serum alpha-fetoprotein), elevated Growth normal last month F/U growth scan next month  3. History of preterm delivery, currently pregnant No evidence at present   Preterm labor symptoms and general obstetric precautions including but not limited to vaginal bleeding, contractions, leaking of fluid and fetal movement were reviewed in detail with the patient. I discussed the assessment and treatment plan with the patient. The patient was provided an opportunity to ask questions and all were answered. The patient agreed with the plan and demonstrated an understanding of the instructions. The patient was advised to call back or seek an in-person office evaluation/go to [facility] at [facility] for any urgent or concerning symptoms. Please refer to After Visit Summary for other counseling recommendations.   I provided 8 minutes of face-to-face time during this encounter.  Return in about 2 weeks (around 06/17/2020) for OB visit, face to face, MD only.  Future Appointments  Date Time Provider Department Center  06/25/2020 12:30 PM [facility] NURSE [facility]  06/25/2020 12:45 PM [facility] US5 [facility]    Hermina Staggers, MD Center for [facility], [facility]

## 2020-06-23 ENCOUNTER — Other Ambulatory Visit: Payer: Self-pay | Admitting: *Deleted

## 2020-06-23 DIAGNOSIS — B3731 Acute candidiasis of vulva and vagina: Secondary | ICD-10-CM

## 2020-06-23 DIAGNOSIS — B373 Candidiasis of vulva and vagina: Secondary | ICD-10-CM

## 2020-06-23 MED ORDER — TERCONAZOLE 0.8 % VA CREA: 1.0000 | TOPICAL_CREAM | Freq: Every day | VAGINAL | 0 refills | Status: DC

## 2020-06-23 NOTE — Progress Notes (Signed)
Pt sent Mychart message with report of vaginal itching, burning when she pees and vaginal swelling. She denied frequency of urination. Terazol Rx sent per standing order. Pt was notified via Mychart.

## 2020-06-25 ENCOUNTER — Encounter: Payer: Self-pay | Admitting: *Deleted

## 2020-06-25 ENCOUNTER — Other Ambulatory Visit: Payer: Self-pay | Admitting: *Deleted

## 2020-06-25 ENCOUNTER — Ambulatory Visit: Payer: Medicaid Other | Attending: Obstetrics

## 2020-06-25 ENCOUNTER — Other Ambulatory Visit: Payer: Self-pay

## 2020-06-25 ENCOUNTER — Ambulatory Visit: Payer: Medicaid Other | Admitting: *Deleted

## 2020-06-25 DIAGNOSIS — Z362 Encounter for other antenatal screening follow-up: Secondary | ICD-10-CM

## 2020-06-25 DIAGNOSIS — O09899 Supervision of other high risk pregnancies, unspecified trimester: Secondary | ICD-10-CM | POA: Diagnosis present

## 2020-06-25 DIAGNOSIS — O4443 Low lying placenta NOS or without hemorrhage, third trimester: Secondary | ICD-10-CM

## 2020-06-25 DIAGNOSIS — O09213 Supervision of pregnancy with history of pre-term labor, third trimester: Secondary | ICD-10-CM

## 2020-06-25 DIAGNOSIS — O3440 Maternal care for other abnormalities of cervix, unspecified trimester: Secondary | ICD-10-CM

## 2020-06-25 DIAGNOSIS — O99891 Other specified diseases and conditions complicating pregnancy: Secondary | ICD-10-CM

## 2020-06-25 DIAGNOSIS — R772 Abnormality of alphafetoprotein: Secondary | ICD-10-CM | POA: Diagnosis not present

## 2020-06-25 DIAGNOSIS — Z8659 Personal history of other mental and behavioral disorders: Secondary | ICD-10-CM | POA: Insufficient documentation

## 2020-06-25 DIAGNOSIS — Z3A32 32 weeks gestation of pregnancy: Secondary | ICD-10-CM

## 2020-06-25 DIAGNOSIS — O289 Unspecified abnormal findings on antenatal screening of mother: Secondary | ICD-10-CM

## 2020-06-25 DIAGNOSIS — O0990 Supervision of high risk pregnancy, unspecified, unspecified trimester: Secondary | ICD-10-CM | POA: Diagnosis present

## 2020-07-05 ENCOUNTER — Telehealth (INDEPENDENT_AMBULATORY_CARE_PROVIDER_SITE_OTHER): Payer: Medicaid Other | Admitting: Obstetrics and Gynecology

## 2020-07-05 ENCOUNTER — Encounter: Payer: Self-pay | Admitting: Obstetrics and Gynecology

## 2020-07-05 DIAGNOSIS — O09899 Supervision of other high risk pregnancies, unspecified trimester: Secondary | ICD-10-CM

## 2020-07-05 DIAGNOSIS — F410 Panic disorder [episodic paroxysmal anxiety] without agoraphobia: Secondary | ICD-10-CM

## 2020-07-05 DIAGNOSIS — O99343 Other mental disorders complicating pregnancy, third trimester: Secondary | ICD-10-CM

## 2020-07-05 DIAGNOSIS — O09893 Supervision of other high risk pregnancies, third trimester: Secondary | ICD-10-CM

## 2020-07-05 DIAGNOSIS — O280 Abnormal hematological finding on antenatal screening of mother: Secondary | ICD-10-CM

## 2020-07-05 DIAGNOSIS — O0993 Supervision of high risk pregnancy, unspecified, third trimester: Secondary | ICD-10-CM

## 2020-07-05 DIAGNOSIS — Z3A34 34 weeks gestation of pregnancy: Secondary | ICD-10-CM

## 2020-07-05 DIAGNOSIS — O99413 Diseases of the circulatory system complicating pregnancy, third trimester: Secondary | ICD-10-CM

## 2020-07-05 DIAGNOSIS — I479 Paroxysmal tachycardia, unspecified: Secondary | ICD-10-CM

## 2020-07-05 DIAGNOSIS — O99891 Other specified diseases and conditions complicating pregnancy: Secondary | ICD-10-CM

## 2020-07-05 DIAGNOSIS — Z8659 Personal history of other mental and behavioral disorders: Secondary | ICD-10-CM

## 2020-07-05 DIAGNOSIS — O0990 Supervision of high risk pregnancy, unspecified, unspecified trimester: Secondary | ICD-10-CM

## 2020-07-05 DIAGNOSIS — O09213 Supervision of pregnancy with history of pre-term labor, third trimester: Secondary | ICD-10-CM

## 2020-07-05 NOTE — Progress Notes (Signed)
I connected with  Catherine Carpenter on 07/05/20 at 10:15 AM EDT by telephone and verified that I am speaking with the correct person using two identifiers.   I discussed the limitations, risks, security and privacy concerns of performing an evaluation and management service by telephone and the availability of in person appointments. I also discussed with the patient that there may be a patient responsible charge related to this service. The patient expressed understanding and agreed to proceed.  Ralene Bathe, RN 07/05/2020  9:29 AM

## 2020-07-05 NOTE — Progress Notes (Signed)
° °  TELEHEALTH OBSTETRICS VISIT ENCOUNTER NOTE  I connected with [name] on 07/05/20 at 10:15 AM EDT by telephone at home and verified that I am speaking with the correct person using two identifiers.   I discussed the limitations, risks, security and privacy concerns of performing an evaluation and management service by telephone and the availability of in person appointments. I also discussed with the patient that there may be a patient responsible charge related to this service. The patient expressed understanding and agreed to proceed.  Subjective:  Lakelyn Straus is a 34 y.o. [phone] at [redacted]w[redacted]d being followed for ongoing prenatal care.  She is currently monitored for the following issues for this high-risk pregnancy and has Paroxysmal SVT (supraventricular tachycardia) (HCC); Panic disorder; History of rheumatic fever as a child; Rheumatic fever; History of loop electrical excision procedure (LEEP); Supervision of high risk pregnancy, antepartum; History of preterm delivery, currently pregnant; History of postpartum depression, currently pregnant; and Abnormal MSAFP (maternal serum alpha-fetoprotein), elevated on their problem list.  Patient reports no complaints. Reports fetal movement. Denies any contractions, bleeding or leaking of fluid.   The following portions of the patient's history were reviewed and updated as appropriate: allergies, current medications, past family history, past medical history, past social history, past surgical history and problem list.   Objective:   General:  Alert, oriented and cooperative.   Mental Status: Normal mood and affect perceived. Normal judgment and thought content.  Rest of physical exam deferred due to type of encounter  Assessment and Plan:  Pregnancy: [id number] at [redacted]w[redacted]d 1. Supervision of high risk pregnancy, antepartum Stable GBS next visit  2. Abnormal MSAFP (maternal serum alpha-fetoprotein), elevated Stable Growth 64 % 06/25/20 F/U  growth later this month  3. Panic disorder Stable on current meds  4. History of preterm delivery, currently pregnant No S/Sx at present  5. History of postpartum depression, currently pregnant Stable  Preterm labor symptoms and general obstetric precautions including but not limited to vaginal bleeding, contractions, leaking of fluid and fetal movement were reviewed in detail with the patient.  I discussed the assessment and treatment plan with the patient. The patient was provided an opportunity to ask questions and all were answered. The patient agreed with the plan and demonstrated an understanding of the instructions. The patient was advised to call back or seek an in-person office evaluation/go to MAU at [facility] for any urgent or concerning symptoms. Please refer to After Visit Summary for other counseling recommendations.   I provided 8 minutes of non-face-to-face time during this encounter.  Return in about 2 weeks (around 07/19/2020) for OB visit, face to face, any provider.  Future Appointments  Date Time Provider Department Center  07/05/2020 10:15 AM Hermina Staggers, MD [facility]  07/23/2020  2:45 PM [facility] NURSE [facility]  07/23/2020  3:00 PM [facility] US1 [facility]    Hermina Staggers, MD [facility], [facility]

## 2020-07-20 ENCOUNTER — Encounter (HOSPITAL_COMMUNITY): Payer: Self-pay | Admitting: Obstetrics and Gynecology

## 2020-07-20 ENCOUNTER — Other Ambulatory Visit: Payer: Self-pay

## 2020-07-20 ENCOUNTER — Inpatient Hospital Stay (HOSPITAL_COMMUNITY)
Admission: AD | Admit: 2020-07-20 | Discharge: 2020-07-22 | DRG: 833 | Disposition: A | Discharge status: Home / Self Care | Payer: Medicaid Other | Attending: Obstetrics and Gynecology | Admitting: Obstetrics and Gynecology

## 2020-07-20 DIAGNOSIS — O99891 Other specified diseases and conditions complicating pregnancy: Secondary | ICD-10-CM

## 2020-07-20 DIAGNOSIS — F410 Panic disorder [episodic paroxysmal anxiety] without agoraphobia: Secondary | ICD-10-CM | POA: Diagnosis present

## 2020-07-20 DIAGNOSIS — Z880 Allergy status to penicillin: Secondary | ICD-10-CM

## 2020-07-20 DIAGNOSIS — Z20822 Contact with and (suspected) exposure to covid-19: Secondary | ICD-10-CM | POA: Diagnosis present

## 2020-07-20 DIAGNOSIS — O09899 Supervision of other high risk pregnancies, unspecified trimester: Secondary | ICD-10-CM

## 2020-07-20 DIAGNOSIS — Z3A36 36 weeks gestation of pregnancy: Secondary | ICD-10-CM

## 2020-07-20 DIAGNOSIS — Z8659 Personal history of other mental and behavioral disorders: Secondary | ICD-10-CM

## 2020-07-20 DIAGNOSIS — O0990 Supervision of high risk pregnancy, unspecified, unspecified trimester: Secondary | ICD-10-CM

## 2020-07-20 DIAGNOSIS — Z87891 Personal history of nicotine dependence: Secondary | ICD-10-CM

## 2020-07-20 DIAGNOSIS — O99343 Other mental disorders complicating pregnancy, third trimester: Secondary | ICD-10-CM | POA: Diagnosis present

## 2020-07-20 NOTE — MAU Note (Signed)
Pt reports she started having 7-8 min apart. ctx about 45 min ago called triage nurse and told to come in.Pt reports some leaking of fluid at the same time the ctx started. Good fetal movement reported.

## 2020-07-20 NOTE — MAU Provider Note (Addendum)
History  Chief Complaint:  Contractions  Catherine Carpenter is a 34 y.o. [phone] female at [redacted]w[redacted]d presenting w/ report of uc's x ~1.5hrs, went to br to void, then felt leakage from vagina. Has continued to trickle some.     Reports active fetal movement, contractions: regular, vaginal bleeding: none, membranes: clear fluid. Denies uti s/s, abnormal/malodorous vag d/c or vulvovaginal itching/irritation.   Prenatal care at [facility].  Next visit Friday. Pregnancy complicated by elevated AFP/increased r/f OSB w/ normal detailed MFM u/s. SVT s/p ablation, panic disorder, h/o LEEP, h/o 35wk PTB  Obstetrical History: OB History    Gravida  4   Para  2   Term  1   Preterm  1   AB  1   Living  2     SAB      TAB  1   Ectopic      Multiple  0   Live Births  2           Past Medical History: Past Medical History:  Diagnosis Date  . History of rheumatic fever as a child   . Panic disorder   . Paroxysmal SVT (supraventricular tachycardia) (HCC)   . Postpartum depression   . Preterm labor   . Vaginal Pap smear, abnormal    LEEP 2014    Past Surgical History: Past Surgical History:  Procedure Laterality Date  . CARDIAC ELECTROPHYSIOLOGY STUDY AND ABLATION    . COLPOSCOPY    . INDUCED ABORTION    . LEEP  2014  . WISDOM TOOTH EXTRACTION      Social History: Social History   Socioeconomic History  . Marital status: Single    Spouse name: Not on file  . Number of children: Not on file  . Years of education: Not on file  . Highest education level: Not on file  Occupational History    Comment: unemployed  Tobacco Use  . Smoking status: Former Smoker    Packs/day: 0.50    Types: Cigarettes    Quit date: 08/04/2018    Years since quitting: 1.9  . Smokeless tobacco: Never Used  Vaping Use  . Vaping Use: Some days  . Substances: Flavoring  . Devices: Vuse   Substance and Sexual Activity  . Alcohol use: Not Currently    Alcohol/week: 4.0 standard drinks    Types: 4  Glasses of wine per week    Comment: socially  . Drug use: Never  . Sexual activity: Yes    Partners: Male    Birth control/protection: None  Other Topics Concern  . Not on file  Social History Narrative  . Not on file   Social Determinants of Health   Financial Resource Strain:   . Difficulty of Paying Living Expenses: Not on file  Food Insecurity: No Food Insecurity  . Worried About Programme researcher, broadcasting/film/video in the Last Year: Never true  . Ran Out of Food in the Last Year: Never true  Transportation Needs: No Transportation Needs  . Lack of Transportation (Medical): No  . Lack of Transportation (Non-Medical): No  Physical Activity:   . Days of Exercise per Week: Not on file  . Minutes of Exercise per Session: Not on file  Stress:   . Feeling of Stress : Not on file  Social Connections:   . Frequency of Communication with Friends and Family: Not on file  . Frequency of Social Gatherings with Friends and Family: Not on file  . Attends Religious Services: Not  on file  . Active Member of Clubs or Organizations: Not on file  . Attends Banker Meetings: Not on file  . Marital Status: Not on file    Allergies: Allergies  Allergen Reactions  . Bee Venom Anaphylaxis and Hives  . Penicillins Hives    Did it involve swelling of the face/tongue/throat, SOB, or low BP? No Did it involve sudden or severe rash/hives, skin peeling, or any reaction on the inside of your mouth or nose? No Did you need to seek medical attention at a hospital or doctor's office? No When did it last happen? If all above answers are "NO", may proceed with cephalosporin use.    Medications Prior to Admission  Medication Sig Dispense Refill Last Dose  . calcium carbonate (TUMS - DOSED IN MG ELEMENTAL CALCIUM) 500 MG chewable tablet Chew 2 tablets by mouth as needed for indigestion or heartburn.   07/20/2020 at Unknown time  . citalopram (CELEXA) 20 MG tablet citalopram 20 mg tablet  TAKE 1  TABLET BY MOUTH EVERY DAY   07/20/2020 at Unknown time  . clonazePAM (KLONOPIN) 1 MG tablet TAKE 1 TABLET BY MOUTH 2 TIMES DAILY AS NEEDED FOR ANXIETY. (Patient taking differently: 3 (three) times daily. TAKE 1 TABLET BY MOUTH 3 TIMES DAILY AS NEEDED FOR ANXIETY.) 60 tablet 0 07/20/2020 at Unknown time  . diphenhydrAMINE (BENADRYL) 25 MG tablet Take 25 mg by mouth at bedtime as needed for sleep.    07/19/2020 at Unknown time  . Elastic Bandages & Supports (COMFORT FIT MATERNITY SUPP MED) MISC 1 Device by Does not apply route daily as needed. 1 each 0 07/20/2020 at Unknown time  . Prenatal Vit-Fe Fumarate-FA (MULTIVITAMIN-PRENATAL) 27-0.8 MG TABS tablet Take 1 tablet by mouth daily at 12 noon.   07/20/2020 at Unknown time  . acetaminophen (TYLENOL) 325 MG tablet Take 2 tablets (650 mg total) by mouth every 4 (four) hours as needed (for pain scale < 4). 30 tablet 0     Review of Systems  Pertinent pos/neg as indicated in HPI  Physical Exam  Blood pressure 116/62, pulse 90, temperature 98.5 F (36.9 C), resp. rate 18, height 5\' 7"  (1.702 m), weight 78.5 kg, last menstrual period 11/10/2019, unknown if currently breastfeeding. General appearance: alert, cooperative and no distress Lungs: clear to auscultation bilaterally, normal effort Heart: regular rate and rhythm Abdomen: gravid, soft, non-tender Extremities: no edema  Spec exam: cx visually closed, no pooling, no change w/ valsalva, fern neg Cultures/Specimens: fern, gbs Dilation: 2.5 Effacement (%): 80 Cervical Position: Posterior Presentation: Vertex Exam by:: K Latronda Spink posterior Presentation: cephalic  Fetal monitoring: FHR: 135 bpm, variability: moderate,  Accelerations: Present,  decelerations:  Absent Uterine activity: q 2-7 mins  MAU Course  Spec exam, fern (neg) 2339: SVE 1.5/80/-2, posterior 0055: SVE 2.5/80/-2, more anterior, Declined procardia, reviewed risks of PTB 0200: feels like uc's are stronger/closer, SVE w/o  change, lives in [place], offered to observe x 1 more hour, if no change- d/c 0335: 4/80/-2, more uncomfortable, will admit and observe  Labs:  No results found for this or any previous visit (from the past 24 hour(s)).  Imaging:  n/a Assessment and Plan  A:  [redacted]w[redacted]d SIUP  [redacted]w[redacted]d  Early preterm labor  Cat 1 FHR P:  Admit to L&D  Expectant management  BMZ 12mg  q 24hr x 2  GBS cx pending, PCN allergy (hives)- will give Vancomycin per protocol    [id number] CNM,WHNP-BC 11/17/20214:05 AM

## 2020-07-21 DIAGNOSIS — F410 Panic disorder [episodic paroxysmal anxiety] without agoraphobia: Secondary | ICD-10-CM | POA: Diagnosis present

## 2020-07-21 DIAGNOSIS — Z87891 Personal history of nicotine dependence: Secondary | ICD-10-CM | POA: Diagnosis not present

## 2020-07-21 DIAGNOSIS — Z880 Allergy status to penicillin: Secondary | ICD-10-CM | POA: Diagnosis not present

## 2020-07-21 DIAGNOSIS — O99343 Other mental disorders complicating pregnancy, third trimester: Secondary | ICD-10-CM | POA: Diagnosis present

## 2020-07-21 DIAGNOSIS — Z3A36 36 weeks gestation of pregnancy: Secondary | ICD-10-CM | POA: Diagnosis not present

## 2020-07-21 DIAGNOSIS — Z20822 Contact with and (suspected) exposure to covid-19: Secondary | ICD-10-CM | POA: Diagnosis present

## 2020-07-21 LAB — RESPIRATORY PANEL BY RT PCR (FLU A&B, COVID)
Influenza A by PCR: NEGATIVE
Influenza B by PCR: NEGATIVE
SARS Coronavirus 2 by RT PCR: NEGATIVE

## 2020-07-21 LAB — CBC
HCT: 34.3 % — ABNORMAL LOW (ref 36.0–46.0)
Hemoglobin: 11.4 g/dL — ABNORMAL LOW (ref 12.0–15.0)
MCH: 32.5 pg (ref 26.0–34.0)
MCHC: 33.2 g/dL (ref 30.0–36.0)
MCV: 97.7 fL (ref 80.0–100.0)
Platelets: 214 10*3/uL (ref 150–400)
RBC: 3.51 MIL/uL — ABNORMAL LOW (ref 3.87–5.11)
RDW: 12.8 % (ref 11.5–15.5)
WBC: 10.8 10*3/uL — ABNORMAL HIGH (ref 4.0–10.5)
nRBC: 0 % (ref 0.0–0.2)

## 2020-07-21 LAB — OB RESULTS CONSOLE GBS: GBS: NEGATIVE

## 2020-07-21 LAB — TYPE AND SCREEN
ABO/RH(D): O POS
Antibody Screen: NEGATIVE

## 2020-07-21 LAB — RPR: RPR Ser Ql: NONREACTIVE

## 2020-07-21 LAB — AMNISURE RUPTURE OF MEMBRANE (ROM) NOT AT ARMC: Amnisure ROM: NEGATIVE

## 2020-07-21 MED ORDER — OXYTOCIN-SODIUM CHLORIDE 30-0.9 UT/500ML-% IV SOLN: 2.5000 [IU]/h | INTRAVENOUS | Status: DC

## 2020-07-21 MED ORDER — ACETAMINOPHEN 325 MG PO TABS: 650.0000 mg | ORAL_TABLET | ORAL | Status: DC | PRN

## 2020-07-21 MED ORDER — DIPHENHYDRAMINE HCL 25 MG PO CAPS
25.0000 mg | ORAL_CAPSULE | Freq: Four times a day (QID) | ORAL | Status: DC | PRN
  Administered 2020-07-21: 25 mg via ORAL
  Filled 2020-07-21: qty 1

## 2020-07-21 MED ORDER — OXYTOCIN BOLUS FROM INFUSION: 333.0000 mL | Freq: Once | INTRAVENOUS | Status: DC

## 2020-07-21 MED ORDER — SOD CITRATE-CITRIC ACID 500-334 MG/5ML PO SOLN: 30.0000 mL | ORAL | Status: DC | PRN

## 2020-07-21 MED ORDER — DOCUSATE SODIUM 100 MG PO CAPS: 100.0000 mg | ORAL_CAPSULE | Freq: Every day | ORAL | Status: DC

## 2020-07-21 MED ORDER — FENTANYL CITRATE (PF) 100 MCG/2ML IJ SOLN
100.0000 ug | INTRAMUSCULAR | Status: DC | PRN
  Filled 2020-07-21: qty 2

## 2020-07-21 MED ORDER — HYDROXYZINE HCL 50 MG PO TABS: 50.0000 mg | ORAL_TABLET | Freq: Four times a day (QID) | ORAL | Status: DC | PRN

## 2020-07-21 MED ORDER — ZOLPIDEM TARTRATE 5 MG PO TABS: 5.0000 mg | ORAL_TABLET | Freq: Every evening | ORAL | Status: DC | PRN

## 2020-07-21 MED ORDER — FLEET ENEMA 7-19 GM/118ML RE ENEM: 1.0000 | ENEMA | RECTAL | Status: DC | PRN

## 2020-07-21 MED ORDER — LIDOCAINE HCL (PF) 1 % IJ SOLN: 30.0000 mL | INTRAMUSCULAR | Status: DC | PRN

## 2020-07-21 MED ORDER — VANCOMYCIN HCL IN DEXTROSE 1-5 GM/200ML-% IV SOLN
1000.0000 mg | Freq: Two times a day (BID) | INTRAVENOUS | Status: DC
  Administered 2020-07-21: 1000 mg via INTRAVENOUS
  Filled 2020-07-21 (×2): qty 200

## 2020-07-21 MED ORDER — BETAMETHASONE SOD PHOS & ACET 6 (3-3) MG/ML IJ SUSP
12.0000 mg | INTRAMUSCULAR | Status: DC
  Administered 2020-07-21: 12 mg via INTRAMUSCULAR
  Filled 2020-07-21: qty 5

## 2020-07-21 MED ORDER — FENTANYL CITRATE (PF) 100 MCG/2ML IJ SOLN
50.0000 ug | INTRAMUSCULAR | Status: DC | PRN
  Administered 2020-07-21 (×6): 50 ug via INTRAVENOUS
  Filled 2020-07-21 (×5): qty 2

## 2020-07-21 MED ORDER — OXYCODONE-ACETAMINOPHEN 5-325 MG PO TABS: 1.0000 | ORAL_TABLET | ORAL | Status: DC | PRN

## 2020-07-21 MED ORDER — LACTATED RINGERS IV SOLN: INTRAVENOUS | Status: DC

## 2020-07-21 MED ORDER — CALCIUM CARBONATE ANTACID 500 MG PO CHEW: 2.0000 | CHEWABLE_TABLET | ORAL | Status: DC | PRN

## 2020-07-21 MED ORDER — ONDANSETRON HCL 4 MG/2ML IJ SOLN: 4.0000 mg | Freq: Four times a day (QID) | INTRAMUSCULAR | Status: DC | PRN

## 2020-07-21 MED ORDER — LACTATED RINGERS IV SOLN: 500.0000 mL | INTRAVENOUS | Status: DC | PRN

## 2020-07-21 MED ORDER — PRENATAL MULTIVITAMIN CH: 1.0000 | ORAL_TABLET | Freq: Every day | ORAL | Status: DC

## 2020-07-21 MED ORDER — OXYCODONE-ACETAMINOPHEN 5-325 MG PO TABS: 2.0000 | ORAL_TABLET | ORAL | Status: DC | PRN

## 2020-07-21 NOTE — H&P (Signed)
Catherine Carpenter is a 34 y.o. [phone] female at [redacted]w[redacted]d by LMP c/w 11wk u/s, presenting in early preterm labor.  Evaluated in MAU for multiple hours, changed from 1.5/80 posterior to 4/80/-2 anterior w/ more uncomfortable uc's.    Reports active fetal movement, contractions: regular, vaginal bleeding: spotting, membranes: intact.  Initiated prenatal care at [facility] at 16 wks.   Most recent u/s 06/25/20 @ 32wks, EFW 64% w/ normal AFI.   This pregnancy complicated by: Elevated AFP/increased r/f OSB, normal detailed MFM u/s Paroxysmal SVT H/O LEEP Panic disorder- on celexa and klonopin 1mg  TID  Prenatal History/Complications:  H/O 35wk PTB 50yrs ago, then term uncomplicated SVB 17yr ago Previous EAB x 1  Past Medical History: Past Medical History:  Diagnosis Date  . History of rheumatic fever as a child   . Panic disorder   . Paroxysmal SVT (supraventricular tachycardia) (HCC)   . Postpartum depression   . Preterm labor   . Vaginal Pap smear, abnormal    LEEP 2014    Past Surgical History: Past Surgical History:  Procedure Laterality Date  . CARDIAC ELECTROPHYSIOLOGY STUDY AND ABLATION    . COLPOSCOPY    . INDUCED ABORTION    . LEEP  2014  . WISDOM TOOTH EXTRACTION      Obstetrical History: OB History    Gravida  4   Para  2   Term  1   Preterm  1   AB  1   Living  2     SAB      TAB  1   Ectopic      Multiple  0   Live Births  2           Social History: Social History   Socioeconomic History  . Marital status: Single    Spouse name: Not on file  . Number of children: Not on file  . Years of education: Not on file  . Highest education level: Not on file  Occupational History    Comment: unemployed  Tobacco Use  . Smoking status: Former Smoker    Packs/day: 0.50    Types: Cigarettes    Quit date: 08/04/2018    Years since quitting: 1.9  . Smokeless tobacco: Never Used  Vaping Use  . Vaping Use: Some days  . Substances: Flavoring  . Devices:  Vuse   Substance and Sexual Activity  . Alcohol use: Not Currently    Alcohol/week: 4.0 standard drinks    Types: 4 Glasses of wine per week    Comment: socially  . Drug use: Never  . Sexual activity: Yes    Partners: Male    Birth control/protection: None  Other Topics Concern  . Not on file  Social History Narrative  . Not on file   Social Determinants of Health   Financial Resource Strain:   . Difficulty of Paying Living Expenses: Not on file  Food Insecurity: No Food Insecurity  . Worried About 14/09/2017 in the Last Year: Never true  . Ran Out of Food in the Last Year: Never true  Transportation Needs: No Transportation Needs  . Lack of Transportation (Medical): No  . Lack of Transportation (Non-Medical): No  Physical Activity:   . Days of Exercise per Week: Not on file  . Minutes of Exercise per Session: Not on file  Stress:   . Feeling of Stress : Not on file  Social Connections:   . Frequency of Communication with Friends and  Family: Not on file  . Frequency of Social Gatherings with Friends and Family: Not on file  . Attends Religious Services: Not on file  . Active Member of Clubs or Organizations: Not on file  . Attends Banker Meetings: Not on file  . Marital Status: Not on file    Family History: Family History  Problem Relation Age of Onset  . Thyroid disease Mother   . Depression Mother   . Diabetes Mother        Hypoglycemia  . Depression Father   . Hypertension Father   . Hyperlipidemia Father   . Depression Sister   . Anxiety disorder Sister   . Epilepsy Sister   . Fibroids Sister   . Seizures Sister   . Fibromyalgia Sister   . Anxiety disorder Brother   . Depression Brother   . Depression Brother   . Anxiety disorder Brother   . Anxiety disorder Brother   . Depression Brother   . Diabetes Paternal Grandmother     Allergies: Allergies  Allergen Reactions  . Bee Venom Anaphylaxis and Hives  . Penicillins Hives     Did it involve swelling of the face/tongue/throat, SOB, or low BP? No Did it involve sudden or severe rash/hives, skin peeling, or any reaction on the inside of your mouth or nose? No Did you need to seek medical attention at a hospital or doctor's office? No When did it last happen? If all above answers are "NO", may proceed with cephalosporin use.    Medications Prior to Admission  Medication Sig Dispense Refill Last Dose  . calcium carbonate (TUMS - DOSED IN MG ELEMENTAL CALCIUM) 500 MG chewable tablet Chew 2 tablets by mouth as needed for indigestion or heartburn.   07/20/2020 at Unknown time  . citalopram (CELEXA) 20 MG tablet citalopram 20 mg tablet  TAKE 1 TABLET BY MOUTH EVERY DAY   07/20/2020 at Unknown time  . clonazePAM (KLONOPIN) 1 MG tablet TAKE 1 TABLET BY MOUTH 2 TIMES DAILY AS NEEDED FOR ANXIETY. (Patient taking differently: 3 (three) times daily. TAKE 1 TABLET BY MOUTH 3 TIMES DAILY AS NEEDED FOR ANXIETY.) 60 tablet 0 07/20/2020 at Unknown time  . diphenhydrAMINE (BENADRYL) 25 MG tablet Take 25 mg by mouth at bedtime as needed for sleep.    07/19/2020 at Unknown time  . Elastic Bandages & Supports (COMFORT FIT MATERNITY SUPP MED) MISC 1 Device by Does not apply route daily as needed. 1 each 0 07/20/2020 at Unknown time  . Prenatal Vit-Fe Fumarate-FA (MULTIVITAMIN-PRENATAL) 27-0.8 MG TABS tablet Take 1 tablet by mouth daily at 12 noon.   07/20/2020 at Unknown time  . acetaminophen (TYLENOL) 325 MG tablet Take 2 tablets (650 mg total) by mouth every 4 (four) hours as needed (for pain scale < 4). 30 tablet 0     Review of Systems  Pertinent pos/neg as indicated in HPI  Blood pressure 116/62, pulse 90, temperature 98.5 F (36.9 C), resp. rate 18, height 5\' 7"  (1.702 m), weight 78.5 kg, last menstrual period 11/10/2019, unknown if currently breastfeeding. General appearance: alert, cooperative and mild distress Lungs: clear to auscultation bilaterally Heart: regular  rate and rhythm Abdomen: gravid, soft, non-tender Extremities: no edema  Fetal monitoring: FHR: 125 bpm, variability: moderate,  Accelerations: Present,  decelerations:  Absent Uterine activity: q 2-14mins SVE: 4/80/-2, vtx  Prenatal labs: ABO, Rh: O/Positive/-- (06/14 1551) Antibody: Negative (06/14 1551) Rubella: 4.30 (06/14 1551) RPR: Non Reactive (09/13 1005)  HBsAg: Negative (06/14  1551)  HIV: Non Reactive (09/13 1005)  GBS:   pending (cx collected 11/17) 2hr GTT: normal  No results found for this or any previous visit (from the past 24 hour(s)).   Assessment:  [redacted]w[redacted]d SIUP  [id number]  Early preterm labor  Cat 1 FHR  GBS  unknown/pending  H/O 35wk PTB  H/O LEEP  Elevated AFP/increased r/f OSB w/ normal u/s  Plan:  Admit to L&D  IV pain meds/epidural prn active labor  Expectant management  Vancomycin per protocol d/t PTL, GBS unknown/pending, PCN allergy (hives)  BMZ 12mg  IM q 24hr x 2  Anticipate NSVB   Plans to breastfeed  Contraception: undecided  Circumcision: yes  CNM, WHNP-BC 07/21/2020, 4:10 AM

## 2020-07-21 NOTE — Progress Notes (Signed)
Catherine Carpenter is a 34 y.o. [phone] at [redacted]w[redacted]d admitted for possible early labor  Subjective:  Patient comfortable and contractions have spaced significantly at this time. She is wondering if we can augment her labor at this time.   Objective: BP 115/60 (BP Location: Right Arm)   Pulse 81   Temp 98.9 F (37.2 C) (Oral)   Resp 17   Ht 5\' 7"  (1.702 m)   Wt 78.2 kg   LMP 11/10/2019   BMI 27.00 kg/m  No intake/output data recorded. No intake/output data recorded.  FHT:  FHR: 120 bpm, variability: moderate,  accelerations:  Present,  decelerations:  Absent UC:   Rare about every 20+ mins  SVE:   Dilation: 4 Effacement (%): 50 Station: -2 Exam by:: [email or web address], CNM  Labs: Lab Results  Component Value Date   WBC 10.8 (H) 07/21/2020   HGB 11.4 (L) 07/21/2020   HCT 34.3 (L) 07/21/2020   MCV 97.7 07/21/2020   PLT 214 07/21/2020    Assessment / Plan: No cervical change since 0330 this morning. Contractions have spaced to the point of being almost nonexistent.  Discussion in rounds this morning was to consider DC home or transfer to [facility] if no change/no labor. [facility] to [facility], and patient prefers this at this time due to distance from the hospital.   Labor: latent/false labor Preeclampsia:  NA Fetal Wellbeing:  Category I Pain Control:  IV pain meds I/D:  n/a Anticipated MOD:  NSVD   Will transfer to [facility] for OBS and plan depending on change in labor status   [facility] DNP, CNM  07/21/20  12:04 PM

## 2020-07-21 NOTE — Progress Notes (Signed)
Catherine Carpenter is a 34 y.o. [phone] at [redacted]w[redacted]d admitted for early labor  Subjective: requesting pain meds and uncomfortable w/ contractions  Objective: BP 110/65   Pulse 65   Temp 98.5 F (36.9 C)   Resp 18   Ht 5\' 7"  (1.702 m)   Wt 78.5 kg   LMP 11/10/2019   BMI 27.10 kg/m  No intake/output data recorded.  FHT:  FHR: 135 bpm, variability: moderate,  accelerations:  Present,  decelerations:  Absent UC:   q 2-11mins  SVE:   Dilation: 4.5 Effacement (%): 80 Station: -2 Exam by:: [email or web address] CNM  Labs: Lab Results  Component Value Date   WBC 10.8 (H) 07/21/2020   HGB 11.4 (L) 07/21/2020   HCT 34.3 (L) 07/21/2020   MCV 97.7 07/21/2020   PLT 214 07/21/2020    Assessment / Plan: Early preterm labor, making slow cervical change. Expectant management. Will continue to monitor. Understands if uc's stop/space out/quits changing cx may go home  Labor: early Fetal Wellbeing:  Category I Pain Control:  IV pain meds Pre-eclampsia: n/a I/D:  Vanc for GBS unknown/pending, PTL, hives to PCN Anticipated MOD: NSVB  07/23/2020 CNM, WHNP-BC 07/21/2020, 7:15 AM

## 2020-07-22 DIAGNOSIS — Z3A36 36 weeks gestation of pregnancy: Secondary | ICD-10-CM

## 2020-07-22 MED ORDER — DOXYLAMINE SUCCINATE (SLEEP) 25 MG PO TABS: 25.0000 mg | ORAL_TABLET | Freq: Every evening | ORAL | 2 refills | Status: DC | PRN

## 2020-07-22 MED ORDER — BETAMETHASONE SOD PHOS & ACET 6 (3-3) MG/ML IJ SUSP
12.0000 mg | Freq: Once | INTRAMUSCULAR | Status: AC
  Administered 2020-07-22: 12 mg via INTRAMUSCULAR
  Filled 2020-07-22: qty 5

## 2020-07-22 NOTE — Discharge Instructions (Signed)
Preventing Preterm Birth Preterm birth is when your baby is delivered between 20 weeks and 37 weeks of pregnancy. A full-term pregnancy lasts for at least 37 weeks. Preterm birth can be dangerous for your baby because the last few weeks of pregnancy are an important time for your baby's brain and lungs to grow. Many things can cause a baby to be born early. Sometimes the cause is not known. There are certain factors that make you more likely to experience preterm birth, such as:  Having a previous baby born preterm.  Being pregnant with twins or other multiples.  Having had fertility treatment.  Being overweight or underweight at the start of your pregnancy.  Having any of the following during pregnancy: ? An infection, including a urinary tract infection (UTI) or an STI (sexually transmitted infection). ? High blood pressure. ? Diabetes. ? Vaginal bleeding.  Being age 35 or older.  Being age 18 or younger.  Getting pregnant within 6 months of a previous pregnancy.  Suffering extreme stress or physical or emotional abuse during pregnancy.  Standing for long periods of time during pregnancy, such as working at a job that requires standing. What are the risks? The most serious risk of preterm birth is that the baby may not survive. This is more likely to happen if a baby is born before 34 weeks. Other risks and complications of preterm birth may include your baby having:  Breathing problems.  Brain damage that affects movement and coordination (cerebral palsy).  Feeding difficulties.  Vision or hearing problems.  Infections or inflammation of the digestive tract (colitis).  Developmental delays.  Learning disabilities.  Higher risk for diabetes, heart disease, and high blood pressure later in life. What can I do to lower my risk?  Medical care The most important thing you can do to lower your risk for preterm birth is to get routine medical care during pregnancy (prenatal  care). If you have a high risk of preterm birth, you may be referred to a health care provider who specializes in managing high-risk pregnancies (perinatologist). You may be given medicine to help prevent preterm birth. Lifestyle changes Certain lifestyle changes can also lower your risk of preterm birth:  Wait at least 6 months after a pregnancy to become pregnant again.  Try to plan pregnancy for when you are between 19 and 35 years old.  Get to a healthy weight before getting pregnant. If you are overweight, work with your health care provider to safely lose weight.  Do not use any products that contain nicotine or tobacco, such as cigarettes and e-cigarettes. If you need help quitting, ask your health care provider.  Do not drink alcohol.  Do not use drugs. Where to find support For more support, consider:  Talking with your health care provider.  Talking with a therapist or substance abuse counselor, if you need help quitting.  Working with a diet and nutrition specialist (dietitian) or a personal trainer to maintain a healthy weight.  Joining a support group. Where to find more information Learn more about preventing preterm birth from:  Centers for Disease Control and Prevention: cdc.gov/reproductivehealth/maternalinfanthealth/pretermbirth.htm  March of Dimes: marchofdimes.org/complications/premature-babies.aspx  American Pregnancy Association: americanpregnancy.org/labor-and-birth/premature-labor Contact a health care provider if:  You have any of the following signs of preterm labor before 37 weeks: ? A change or increase in vaginal discharge. ? Fluid leaking from your vagina. ? Pressure or cramps in your lower abdomen. ? A backache that does not go away or gets worse. ?   Regular tightening (contractions) in your lower abdomen. Summary  Preterm birth means having your baby during weeks 20-37 of pregnancy.  Preterm birth may put your baby at risk for physical and  mental problems.  Getting good prenatal care can help prevent preterm birth.  You can lower your risk of preterm birth by making certain lifestyle changes, such as not smoking and not using alcohol. This information is not intended to replace advice given to you by your health care provider. Make sure you discuss any questions you have with your health care provider. Document Revised: 08/03/2017 Document Reviewed: 04/29/2016 Elsevier Patient Education  2020 [employer]  

## 2020-07-22 NOTE — Progress Notes (Signed)
Discharge instructions given to patient, discussed follow up appointments tomorrow on 11/19. Reviewed medication and signs/symptoms of preterm labor. Patient verbalized understanding.

## 2020-07-22 NOTE — Discharge Summary (Signed)
Physician Discharge Summary  Patient ID: Catherine Carpenter MRN: [id number] DOB/AGE: 12/06/1985 34 y.o.  Admit date: 07/20/2020 Discharge date: 07/22/2020  Admission Diagnoses: Pregnancy at 36/1. Preterm labor.   Discharge Diagnoses: Pregnancy at 36/3. Resolved preterm labor  Discharged Condition: good  Hospital Course: patient presented to [facility] triage on 11/16 with regular contractions. Her cervix changed from 1.5/80 to 4/80 over the course of 4 hours so patient was admitted to L&D for PTL, started on antenatal steroids and expectantly managed. Her cervix was unchanged 8 hours later and her UCs and ROS were improved so patient was moved to antepartum; on L&D she thought her water had broken but her amnisure was negative and her admit speculum exam was negative for pooling and valsalva.  On AP, pt did well and felt well and her cervix was 3/30/ballotable/BOW intact. She was discharged home  Consults: None  Significant Diagnostic Studies: fetal monitoring  Treatments: betamethasone 11/17 and 11/18  Discharge Exam: Blood pressure (!) 101/50, pulse 91, temperature 98.4 F (36.9 C), temperature source Oral, resp. rate 17, height 5\' 7"  (1.702 m), weight 78.2 kg, last menstrual period 11/10/2019, SpO2 96 %, unknown if currently breastfeeding. General appearance: alert Resp: clear to auscultation bilaterally Cardio: regular rate and rhythm, S1, S2 normal, no murmur, click, rub or gallop Pelvic: 3/30/ballotable/bow intact, no blood Extremities: extremities normal, atraumatic, no cyanosis or edema Skin: Skin color, texture, turgor normal. No rashes or lesions Neurologic: Grossly normal  Disposition: Discharge disposition: 01-Home or Self Care       Discharge Instructions    Discharge patient   Complete by: As directed    Discharge disposition: 01-Home or Self Care   Discharge patient date: 07/22/2020     Allergies as of 07/22/2020      Reactions   Bee Venom Anaphylaxis, Hives    Penicillins Hives   Did it involve swelling of the face/tongue/throat, SOB, or low BP? No Did it involve sudden or severe rash/hives, skin peeling, or any reaction on the inside of your mouth or nose? No Did you need to seek medical attention at a hospital or doctor's office? No When did it last happen? If all above answers are "NO", may proceed with cephalosporin use.      Medication List    STOP taking these medications   diphenhydrAMINE 25 MG tablet Commonly known as: BENADRYL     TAKE these medications   acetaminophen 325 MG tablet Commonly known as: Tylenol Take 2 tablets (650 mg total) by mouth every 4 (four) hours as needed (for pain scale < 4).   calcium carbonate 500 MG chewable tablet Commonly known as: TUMS - dosed in mg elemental calcium Chew 2 tablets by mouth as needed for indigestion or heartburn.   citalopram 20 MG tablet Commonly known as: CELEXA citalopram 20 mg tablet  TAKE 1 TABLET BY MOUTH EVERY DAY   clonazePAM 1 MG tablet Commonly known as: KLONOPIN TAKE 1 TABLET BY MOUTH 2 TIMES DAILY AS NEEDED FOR ANXIETY. What changed:   when to take this  additional instructions   Comfort Fit Maternity Supp Med Misc 1 Device by Does not apply route daily as needed.   doxylamine (Sleep) 25 MG tablet Commonly known as: UNISOM Take 1 tablet (25 mg total) by mouth at bedtime as needed (nausea and vomiting).   multivitamin-prenatal 27-0.8 MG Tabs tablet Take 1 tablet by mouth daily at 12 noon.       Follow-up Information    Center for 07/24/2020  at [facility] Follow up.   Specialty: Obstetrics and Gynecology Why: appointment and ultrasound Contact information: [address] [place] [address] [phone]              Signed: Bainbridge Bing 07/22/2020, 9:07 AM

## 2020-07-23 ENCOUNTER — Encounter: Payer: Self-pay | Admitting: *Deleted

## 2020-07-23 ENCOUNTER — Other Ambulatory Visit (HOSPITAL_COMMUNITY)
Admission: RE | Admit: 2020-07-23 | Discharge: 2020-07-23 | Disposition: A | Discharge status: Home / Self Care | Payer: Medicaid Other | Source: Ambulatory Visit | Attending: Obstetrics and Gynecology | Admitting: Obstetrics and Gynecology

## 2020-07-23 ENCOUNTER — Ambulatory Visit: Payer: Medicaid Other | Attending: Obstetrics and Gynecology

## 2020-07-23 ENCOUNTER — Ambulatory Visit (INDEPENDENT_AMBULATORY_CARE_PROVIDER_SITE_OTHER): Payer: Medicaid Other | Admitting: Obstetrics and Gynecology

## 2020-07-23 ENCOUNTER — Ambulatory Visit: Payer: Medicaid Other

## 2020-07-23 ENCOUNTER — Other Ambulatory Visit: Payer: Self-pay

## 2020-07-23 ENCOUNTER — Ambulatory Visit: Payer: Medicaid Other | Admitting: *Deleted

## 2020-07-23 VITALS — BP 117/75 | HR 106 | Wt 175.5 lb

## 2020-07-23 DIAGNOSIS — O0990 Supervision of high risk pregnancy, unspecified, unspecified trimester: Secondary | ICD-10-CM

## 2020-07-23 DIAGNOSIS — Z8659 Personal history of other mental and behavioral disorders: Secondary | ICD-10-CM | POA: Diagnosis present

## 2020-07-23 DIAGNOSIS — Z8679 Personal history of other diseases of the circulatory system: Secondary | ICD-10-CM

## 2020-07-23 DIAGNOSIS — Z362 Encounter for other antenatal screening follow-up: Secondary | ICD-10-CM

## 2020-07-23 DIAGNOSIS — O99891 Other specified diseases and conditions complicating pregnancy: Secondary | ICD-10-CM | POA: Diagnosis present

## 2020-07-23 DIAGNOSIS — O289 Unspecified abnormal findings on antenatal screening of mother: Secondary | ICD-10-CM | POA: Diagnosis not present

## 2020-07-23 DIAGNOSIS — O09213 Supervision of pregnancy with history of pre-term labor, third trimester: Secondary | ICD-10-CM

## 2020-07-23 DIAGNOSIS — O280 Abnormal hematological finding on antenatal screening of mother: Secondary | ICD-10-CM

## 2020-07-23 DIAGNOSIS — O4443 Low lying placenta NOS or without hemorrhage, third trimester: Secondary | ICD-10-CM

## 2020-07-23 DIAGNOSIS — O09899 Supervision of other high risk pregnancies, unspecified trimester: Secondary | ICD-10-CM | POA: Insufficient documentation

## 2020-07-23 DIAGNOSIS — I471 Supraventricular tachycardia: Secondary | ICD-10-CM

## 2020-07-23 DIAGNOSIS — Z3A36 36 weeks gestation of pregnancy: Secondary | ICD-10-CM | POA: Insufficient documentation

## 2020-07-23 DIAGNOSIS — R772 Abnormality of alphafetoprotein: Secondary | ICD-10-CM

## 2020-07-23 DIAGNOSIS — O3443 Maternal care for other abnormalities of cervix, third trimester: Secondary | ICD-10-CM

## 2020-07-23 DIAGNOSIS — O219 Vomiting of pregnancy, unspecified: Secondary | ICD-10-CM | POA: Insufficient documentation

## 2020-07-23 DIAGNOSIS — Z9889 Other specified postprocedural states: Secondary | ICD-10-CM

## 2020-07-23 LAB — CULTURE, BETA STREP (GROUP B ONLY)

## 2020-07-23 MED ORDER — ONDANSETRON 4 MG PO TBDP: 4.0000 mg | ORAL_TABLET | Freq: Four times a day (QID) | ORAL | 0 refills | Status: DC | PRN

## 2020-07-23 NOTE — Progress Notes (Signed)
Pt states" admitted to hospital 11/16-11/18 for PTL."

## 2020-07-23 NOTE — Patient Instructions (Signed)

## 2020-07-23 NOTE — Progress Notes (Signed)
   PRENATAL VISIT NOTE  Subjective:  Catherine Carpenter is a 34 y.o. [phone] at [redacted]w[redacted]d being seen today for ongoing prenatal care.  She is currently monitored for the following issues for this high-risk pregnancy and has Paroxysmal SVT (supraventricular tachycardia) (HCC); Panic disorder; History of rheumatic fever as a child; Rheumatic fever; History of loop electrical excision procedure (LEEP); Supervision of high risk pregnancy, antepartum; History of preterm delivery, currently pregnant; History of postpartum depression, currently pregnant; Abnormal MSAFP (maternal serum alpha-fetoprotein), elevated; Preterm labor in third trimester; and Preterm labor on their problem list.  Patient doing well with no acute concerns today. She reports nausea.  Contractions: Irritability. Vag. Bleeding: Bloody Show.  Movement: Present. Denies leaking of fluid.   The following portions of the patient's history were reviewed and updated as appropriate: allergies, current medications, past family history, past medical history, past social history, past surgical history and problem list. Problem list updated.  Objective:   Vitals:   07/23/20 1008  BP: 117/75  Pulse: (!) 106  Weight: 175 lb 8 oz (79.6 kg)    Fetal Status: Fetal Heart Rate (bpm): 135   Movement: Present  Presentation: Vertex  General:  Alert, oriented and cooperative. Patient is in no acute distress.  Skin: Skin is warm and dry. No rash noted.   Cardiovascular: Normal heart rate noted  Respiratory: Normal respiratory effort, no problems with respiration noted  Abdomen: Soft, gravid, appropriate for gestational age.  Pain/Pressure: Present     Pelvic: Cervical exam performed Dilation: 2.5 Effacement (%): 70 Station: -2  Extremities: Normal range of motion.  Edema: Trace  Mental Status:  Normal mood and affect. Normal behavior. Normal judgment and thought content.   Assessment and Plan:  Pregnancy: [id number] at [redacted]w[redacted]d  1. Supervision of high risk  pregnancy, antepartum Long discussion with patient regarding IOL, due to distance from hospital, multiparity, history of false labor with hospital admission and cervical dilation, she wishes to have elective IOL at 39 weeks.  Did discuss if IOL was not effective or the fetus did not tolerate labor she could possibly get a cesarean section.  That being said, the patient has a favorable cervix and history. - GC/Chlamydia probe amp ([place])not at [facility]  2. History of loop electrical excision procedure (LEEP)   3. History of rheumatic fever as a child   4. Paroxysmal SVT (supraventricular tachycardia) (HCC)   5. History of preterm delivery, currently pregnant Recent hospital admission with dilation to 4 cm, today the pt is 2.5 - 3 cm  6. Abnormal MSAFP (maternal serum alpha-fetoprotein), elevated   7. Preterm labor in third trimester without delivery  8. Nausea in pregnancy:   rx for zofran given   Preterm labor symptoms and general obstetric precautions including but not limited to vaginal bleeding, contractions, leaking of fluid and fetal movement were reviewed in detail with the patient.  Please refer to After Visit Summary for other counseling recommendations.   Return in about 1 week (around 07/30/2020) for in person, [facility].   Mariel Aloe, MD

## 2020-07-24 ENCOUNTER — Inpatient Hospital Stay (HOSPITAL_COMMUNITY)
Admission: AD | Admit: 2020-07-24 | Discharge: 2020-07-25 | Disposition: A | Discharge status: Home / Self Care | Payer: Medicaid Other | Source: Ambulatory Visit | Attending: Obstetrics and Gynecology | Admitting: Obstetrics and Gynecology

## 2020-07-24 ENCOUNTER — Encounter (HOSPITAL_COMMUNITY): Payer: Self-pay | Admitting: Obstetrics and Gynecology

## 2020-07-24 ENCOUNTER — Other Ambulatory Visit: Payer: Self-pay

## 2020-07-24 DIAGNOSIS — O4703 False labor before 37 completed weeks of gestation, third trimester: Secondary | ICD-10-CM | POA: Insufficient documentation

## 2020-07-24 DIAGNOSIS — Z3A36 36 weeks gestation of pregnancy: Secondary | ICD-10-CM | POA: Diagnosis not present

## 2020-07-24 DIAGNOSIS — Z0371 Encounter for suspected problem with amniotic cavity and membrane ruled out: Secondary | ICD-10-CM | POA: Diagnosis present

## 2020-07-24 DIAGNOSIS — O479 False labor, unspecified: Secondary | ICD-10-CM

## 2020-07-24 LAB — WET PREP, GENITAL
Clue Cells Wet Prep HPF POC: NONE SEEN
Sperm: NONE SEEN
Trich, Wet Prep: NONE SEEN
Yeast Wet Prep HPF POC: NONE SEEN

## 2020-07-24 LAB — POCT FERN TEST: POCT Fern Test: NEGATIVE

## 2020-07-24 MED ORDER — FENTANYL CITRATE (PF) 100 MCG/2ML IJ SOLN
100.0000 ug | Freq: Once | INTRAMUSCULAR | Status: AC
  Administered 2020-07-24: 100 ug via INTRAMUSCULAR
  Filled 2020-07-24: qty 2

## 2020-07-24 NOTE — MAU Note (Signed)
.   Catherine Carpenter is a 34 y.o. at [redacted]w[redacted]d here in MAU reporting: Possible SROM at 1800. Patient felt a pop and gush of fluid. Has a pad on. No VB. Endorses good fetal movement. Was 4/70/-1 at the office on Friday. Ctx 6 minutes apart.   Pain score: 5 Vitals:   07/24/20 1918  BP: 121/69  Pulse: (!) 107  Resp: 15  Temp: 98.5 F (36.9 C)  SpO2: 99%     FHT:146

## 2020-07-24 NOTE — MAU Provider Note (Signed)
First Provider Initiated Contact with Patient 07/24/20 2004       S: Ms. Daksha Koone is a 34 y.o. [id number] at [redacted]w[redacted]d  who presents to MAU today complaining of leaking of fluid since this evening. Patient reports she was sitting on the couch and heard a pop and then her pants were soaked with a clear, odorless fluid. Patient wore a pad from her house to MAU without any fluid on it upon arrival and denies feeling any continued leaking since that time. She denies vaginal bleeding. She endorses contractions, but is not able to report to the nurse when they are occurring. She reports normal fetal movement.    O: BP 124/70 (BP Location: Right Arm)   Pulse 85   Temp 98.3 F (36.8 C) (Oral)   Resp 16   LMP 11/10/2019   SpO2 99%    Patient Vitals for the past 24 hrs:  BP Temp Temp src Pulse Resp SpO2  07/24/20 1937 124/70 98.3 F (36.8 C) Oral 85 16 --  07/24/20 1918 121/69 98.5 F (36.9 C) Oral (!) 107 15 99 %   GENERAL: Well-developed, well-nourished female in no acute distress.  HEAD: Normocephalic, atraumatic.  CHEST: Normal effort of breathing, regular heart rate ABDOMEN: Soft, nontender, gravid PELVIC: Normal external female genitalia. Vagina is pink and rugated. Cervix with normal contour, no lesions. Normal discharge.  No pooling.   Cervical exam:  Dilation: 3 Effacement (%): 50 Cervical Position: Middle Station: Ballotable Presentation: Vertex Exam by:: NN Bag of water palpated, intact on exam  Fetal Monitoring: reactive Baseline: 140 Variability: moderate Accelerations: present, 15x15 Decelerations: absent Contractions: irritability  No results found for this or any previous visit (from the past 24 hour(s)). Fern negative  A: [facility] at [redacted]w[redacted]d  Membranes intact  P: RN to continue with labor evaluation  Ramsay Bognar, Odie Sera, NP 07/24/2020 8:39 PM

## 2020-07-24 NOTE — MAU Note (Signed)
Fern collected

## 2020-07-25 DIAGNOSIS — O4703 False labor before 37 completed weeks of gestation, third trimester: Secondary | ICD-10-CM | POA: Diagnosis not present

## 2020-07-25 MED ORDER — HYDROXYZINE HCL 50 MG PO TABS
50.0000 mg | ORAL_TABLET | Freq: Once | ORAL | Status: AC
  Administered 2020-07-25: 50 mg via ORAL
  Filled 2020-07-25: qty 1

## 2020-07-25 MED ORDER — HYDROXYZINE PAMOATE 25 MG PO CAPS
25.0000 mg | ORAL_CAPSULE | Freq: Three times a day (TID) | ORAL | 0 refills | Status: DC | PRN
Start: 2020-07-25 — End: 2020-08-02

## 2020-07-25 MED ORDER — HYDROXYZINE PAMOATE 25 MG PO CAPS
25.0000 mg | ORAL_CAPSULE | Freq: Three times a day (TID) | ORAL | 0 refills | Status: DC | PRN
Start: 2020-07-25 — End: 2020-07-25

## 2020-07-25 NOTE — Discharge Instructions (Signed)

## 2020-07-25 NOTE — MAU Provider Note (Signed)
See previous provider note.  Resumed care of the patient @ 2200  RN labor check. Patient here for over 5 hours with no cervical change. Likely 2/2 prodromal labor.  Hx of leep however had a successful vaginal delivery with first pregnancy.  She was give Fentanyl 100 mcg in MAU which helped only minimally.  Discussed s/s of active labor, contraction patterns ect.   Vistaril 50 mg given PO. Patient is present with her significant other who is driving her home. Strict return precautions   Jamese Trauger, Harolyn Rutherford, NP 07/25/2020 1:03 AM

## 2020-07-26 ENCOUNTER — Telehealth: Payer: Self-pay | Admitting: Obstetrics and Gynecology

## 2020-07-26 LAB — GC/CHLAMYDIA PROBE AMP (~~LOC~~) NOT AT ARMC
Chlamydia: NEGATIVE
Comment: NEGATIVE
Comment: NORMAL
Neisseria Gonorrhea: NEGATIVE

## 2020-07-26 NOTE — Telephone Encounter (Signed)
Patient said she was told by Dr.Bass to call here to get her induction set up

## 2020-07-26 NOTE — Telephone Encounter (Signed)
Called pt to determine midnight versus morning induction preference. Pt requests MN. IOL scheduled for 08/09/20 at [facility] per Donavan Foil, MD.   Ridgely RN 07/26/20

## 2020-07-27 ENCOUNTER — Telehealth (HOSPITAL_COMMUNITY): Payer: Self-pay | Admitting: *Deleted

## 2020-07-27 ENCOUNTER — Encounter (HOSPITAL_COMMUNITY): Payer: Self-pay | Admitting: *Deleted

## 2020-07-27 NOTE — Telephone Encounter (Signed)
Preadmission screen  

## 2020-07-31 ENCOUNTER — Other Ambulatory Visit: Payer: Self-pay | Admitting: Advanced Practice Midwife

## 2020-08-02 ENCOUNTER — Ambulatory Visit (INDEPENDENT_AMBULATORY_CARE_PROVIDER_SITE_OTHER): Payer: Medicaid Other | Admitting: Obstetrics and Gynecology

## 2020-08-02 ENCOUNTER — Encounter: Payer: Self-pay | Admitting: Obstetrics and Gynecology

## 2020-08-02 ENCOUNTER — Other Ambulatory Visit: Payer: Self-pay

## 2020-08-02 VITALS — BP 124/72 | HR 98 | Wt 173.1 lb

## 2020-08-02 DIAGNOSIS — O0990 Supervision of high risk pregnancy, unspecified, unspecified trimester: Secondary | ICD-10-CM

## 2020-08-02 DIAGNOSIS — O09899 Supervision of other high risk pregnancies, unspecified trimester: Secondary | ICD-10-CM

## 2020-08-02 DIAGNOSIS — O280 Abnormal hematological finding on antenatal screening of mother: Secondary | ICD-10-CM

## 2020-08-02 DIAGNOSIS — Z9889 Other specified postprocedural states: Secondary | ICD-10-CM

## 2020-08-02 NOTE — Patient Instructions (Signed)

## 2020-08-02 NOTE — Progress Notes (Signed)
Subjective:  Catherine Carpenter is a 34 y.o. [phone] at [redacted]w[redacted]d being seen today for ongoing prenatal care.  She is currently monitored for the following issues for this low-risk pregnancy and has Paroxysmal SVT (supraventricular tachycardia) (HCC); Panic disorder; History of rheumatic fever as a child; Rheumatic fever; History of loop electrical excision procedure (LEEP); Supervision of high risk pregnancy, antepartum; History of preterm delivery, currently pregnant; History of postpartum depression, currently pregnant; Abnormal MSAFP (maternal serum alpha-fetoprotein), elevated; Preterm labor in third trimester; Preterm labor; and Nausea and vomiting during pregnancy on their problem list.  Patient reports general discomforts of pregnancy. Has been seen in MAU for false labor.  Contractions: Irritability. Vag. Bleeding: None.  Movement: Present. Denies leaking of fluid.   The following portions of the patient's history were reviewed and updated as appropriate: allergies, current medications, past family history, past medical history, past social history, past surgical history and problem list. Problem list updated.  Objective:   Vitals:   08/02/20 1044  BP: 124/72  Pulse: 98  Weight: 173 lb 1.6 oz (78.5 kg)    Fetal Status: Fetal Heart Rate (bpm): 144   Movement: Present     General:  Alert, oriented and cooperative. Patient is in no acute distress.  Skin: Skin is warm and dry. No rash noted.   Cardiovascular: Normal heart rate noted  Respiratory: Normal respiratory effort, no problems with respiration noted  Abdomen: Soft, gravid, appropriate for gestational age. Pain/Pressure: Absent     Pelvic:  Cervical exam performed        Extremities: Normal range of motion.  Edema: Trace  Mental Status: Normal mood and affect. Normal behavior. Normal judgment and thought content.   Urinalysis:      Assessment and Plan:  Pregnancy: [id number] at [redacted]w[redacted]d  1. Supervision of high risk pregnancy,  antepartum Labor precautions IOL next Monday  2. Abnormal MSAFP (maternal serum alpha-fetoprotein), elevated Fern today negative  3. History of preterm delivery, currently pregnant   4. History of loop electrical excision procedure (LEEP)   Term labor symptoms and general obstetric precautions including but not limited to vaginal bleeding, contractions, leaking of fluid and fetal movement were reviewed in detail with the patient. Please refer to After Visit Summary for other counseling recommendations.  Return in about 4 weeks (around 08/30/2020).   Hermina Staggers, MD

## 2020-08-06 ENCOUNTER — Telehealth: Payer: Self-pay | Admitting: General Practice

## 2020-08-06 NOTE — Telephone Encounter (Signed)
Called patient and informed her she could do her covid test same day as induction per Dr Macon Large. Patient verbalized understanding.

## 2020-08-07 ENCOUNTER — Other Ambulatory Visit: Payer: Self-pay

## 2020-08-07 ENCOUNTER — Other Ambulatory Visit (HOSPITAL_COMMUNITY): Admission: RE | Admit: 2020-08-07 | Payer: Medicaid Other | Source: Ambulatory Visit

## 2020-08-07 ENCOUNTER — Inpatient Hospital Stay (HOSPITAL_COMMUNITY)
Admission: AD | Admit: 2020-08-07 | Discharge: 2020-08-10 | DRG: 787 | Disposition: A | Discharge status: Home / Self Care | Payer: Medicaid Other | Attending: Obstetrics and Gynecology | Admitting: Obstetrics and Gynecology

## 2020-08-07 DIAGNOSIS — Z20822 Contact with and (suspected) exposure to covid-19: Secondary | ICD-10-CM | POA: Diagnosis present

## 2020-08-07 DIAGNOSIS — Z8659 Personal history of other mental and behavioral disorders: Secondary | ICD-10-CM

## 2020-08-07 DIAGNOSIS — O09899 Supervision of other high risk pregnancies, unspecified trimester: Secondary | ICD-10-CM

## 2020-08-07 DIAGNOSIS — Z9889 Other specified postprocedural states: Secondary | ICD-10-CM

## 2020-08-07 DIAGNOSIS — F410 Panic disorder [episodic paroxysmal anxiety] without agoraphobia: Secondary | ICD-10-CM | POA: Diagnosis present

## 2020-08-07 DIAGNOSIS — Z87891 Personal history of nicotine dependence: Secondary | ICD-10-CM

## 2020-08-07 DIAGNOSIS — O99344 Other mental disorders complicating childbirth: Secondary | ICD-10-CM | POA: Diagnosis present

## 2020-08-07 DIAGNOSIS — O323XX0 Maternal care for face, brow and chin presentation, not applicable or unspecified: Principal | ICD-10-CM | POA: Diagnosis present

## 2020-08-07 DIAGNOSIS — O280 Abnormal hematological finding on antenatal screening of mother: Secondary | ICD-10-CM | POA: Diagnosis present

## 2020-08-07 DIAGNOSIS — O99891 Other specified diseases and conditions complicating pregnancy: Secondary | ICD-10-CM

## 2020-08-07 DIAGNOSIS — Z3A38 38 weeks gestation of pregnancy: Secondary | ICD-10-CM

## 2020-08-07 DIAGNOSIS — O9081 Anemia of the puerperium: Secondary | ICD-10-CM | POA: Diagnosis not present

## 2020-08-07 DIAGNOSIS — D62 Acute posthemorrhagic anemia: Secondary | ICD-10-CM | POA: Diagnosis not present

## 2020-08-07 DIAGNOSIS — Z3490 Encounter for supervision of normal pregnancy, unspecified, unspecified trimester: Secondary | ICD-10-CM | POA: Diagnosis present

## 2020-08-07 DIAGNOSIS — Z880 Allergy status to penicillin: Secondary | ICD-10-CM

## 2020-08-07 NOTE — MAU Note (Signed)
Patient reports to MAU complaining of LOF around 11pm. A gush then and another gush in MAU lobby. Denies vaginal bleeding. +FM. Contractions every 2-4 minutes.  Last SVE: 4cm on 08/06/20

## 2020-08-08 ENCOUNTER — Encounter (HOSPITAL_COMMUNITY): Payer: Self-pay | Admitting: Obstetrics and Gynecology

## 2020-08-08 ENCOUNTER — Encounter (HOSPITAL_COMMUNITY)
Admission: AD | Disposition: A | Discharge status: Home / Self Care | Payer: Self-pay | Source: Home / Self Care | Attending: Obstetrics and Gynecology

## 2020-08-08 ENCOUNTER — Inpatient Hospital Stay (HOSPITAL_COMMUNITY): Payer: Medicaid Other | Admitting: Anesthesiology

## 2020-08-08 DIAGNOSIS — Z87891 Personal history of nicotine dependence: Secondary | ICD-10-CM | POA: Diagnosis not present

## 2020-08-08 DIAGNOSIS — Z3A38 38 weeks gestation of pregnancy: Secondary | ICD-10-CM | POA: Diagnosis not present

## 2020-08-08 DIAGNOSIS — D62 Acute posthemorrhagic anemia: Secondary | ICD-10-CM | POA: Diagnosis not present

## 2020-08-08 DIAGNOSIS — Z20822 Contact with and (suspected) exposure to covid-19: Secondary | ICD-10-CM | POA: Diagnosis present

## 2020-08-08 DIAGNOSIS — O26893 Other specified pregnancy related conditions, third trimester: Secondary | ICD-10-CM | POA: Diagnosis present

## 2020-08-08 DIAGNOSIS — O99344 Other mental disorders complicating childbirth: Secondary | ICD-10-CM | POA: Diagnosis present

## 2020-08-08 DIAGNOSIS — Z880 Allergy status to penicillin: Secondary | ICD-10-CM | POA: Diagnosis not present

## 2020-08-08 DIAGNOSIS — O323XX0 Maternal care for face, brow and chin presentation, not applicable or unspecified: Secondary | ICD-10-CM | POA: Diagnosis present

## 2020-08-08 DIAGNOSIS — Z3490 Encounter for supervision of normal pregnancy, unspecified, unspecified trimester: Secondary | ICD-10-CM | POA: Diagnosis present

## 2020-08-08 DIAGNOSIS — F410 Panic disorder [episodic paroxysmal anxiety] without agoraphobia: Secondary | ICD-10-CM | POA: Diagnosis present

## 2020-08-08 DIAGNOSIS — O9081 Anemia of the puerperium: Secondary | ICD-10-CM | POA: Diagnosis not present

## 2020-08-08 LAB — CBC
HCT: 36.1 % (ref 36.0–46.0)
HCT: 24.5 % — ABNORMAL LOW (ref 36.0–46.0)
Hemoglobin: 12 g/dL (ref 12.0–15.0)
Hemoglobin: 8.2 g/dL — ABNORMAL LOW (ref 12.0–15.0)
MCH: 32.2 pg (ref 26.0–34.0)
MCH: 33.1 pg (ref 26.0–34.0)
MCHC: 33.2 g/dL (ref 30.0–36.0)
MCHC: 33.5 g/dL (ref 30.0–36.0)
MCV: 96.8 fL (ref 80.0–100.0)
MCV: 98.8 fL (ref 80.0–100.0)
Platelets: 226 10*3/uL (ref 150–400)
Platelets: 167 10*3/uL (ref 150–400)
RBC: 3.73 MIL/uL — ABNORMAL LOW (ref 3.87–5.11)
RBC: 2.48 MIL/uL — ABNORMAL LOW (ref 3.87–5.11)
RDW: 12.5 % (ref 11.5–15.5)
RDW: 12.6 % (ref 11.5–15.5)
WBC: 10.4 10*3/uL (ref 4.0–10.5)
WBC: 15.2 10*3/uL — ABNORMAL HIGH (ref 4.0–10.5)
nRBC: 0 % (ref 0.0–0.2)
nRBC: 0 % (ref 0.0–0.2)

## 2020-08-08 LAB — RESP PANEL BY RT-PCR (FLU A&B, COVID) ARPGX2
Influenza A by PCR: NEGATIVE
Influenza B by PCR: NEGATIVE
SARS Coronavirus 2 by RT PCR: NEGATIVE

## 2020-08-08 LAB — CREATININE, SERUM
Creatinine, Ser: 0.62 mg/dL (ref 0.44–1.00)
GFR, Estimated: >60 mL/min (ref >60)

## 2020-08-08 LAB — RPR: RPR Ser Ql: NONREACTIVE

## 2020-08-08 SURGERY — Surgical Case
Anesthesia: Spinal

## 2020-08-08 MED ORDER — SODIUM CHLORIDE 0.9% FLUSH: 3.0000 mL | INTRAVENOUS | Status: DC | PRN

## 2020-08-08 MED ORDER — WITCH HAZEL-GLYCERIN EX PADS: 1.0000 "application " | MEDICATED_PAD | CUTANEOUS | Status: DC | PRN

## 2020-08-08 MED ORDER — FENTANYL CITRATE (PF) 100 MCG/2ML IJ SOLN
50.0000 ug | INTRAMUSCULAR | Status: DC | PRN
  Administered 2020-08-08 (×5): 100 ug via INTRAVENOUS
  Filled 2020-08-08: qty 2

## 2020-08-08 MED ORDER — CLONAZEPAM 1 MG PO TABS
1.0000 mg | ORAL_TABLET | Freq: Three times a day (TID) | ORAL | Status: DC | PRN
  Administered 2020-08-08: 1 mg via ORAL
  Filled 2020-08-08: qty 1

## 2020-08-08 MED ORDER — SOD CITRATE-CITRIC ACID 500-334 MG/5ML PO SOLN
30.0000 mL | ORAL | Status: DC | PRN
  Administered 2020-08-08: 30 mL via ORAL
  Filled 2020-08-08: qty 15

## 2020-08-08 MED ORDER — ONDANSETRON HCL 4 MG/2ML IJ SOLN
4.0000 mg | Freq: Four times a day (QID) | INTRAMUSCULAR | Status: DC | PRN
  Filled 2020-08-08: qty 2

## 2020-08-08 MED ORDER — ONDANSETRON HCL 4 MG/2ML IJ SOLN
INTRAMUSCULAR | Status: AC
  Filled 2020-08-08: qty 2

## 2020-08-08 MED ORDER — NALBUPHINE HCL 10 MG/ML IJ SOLN: 5.0000 mg | Freq: Once | INTRAMUSCULAR | Status: DC | PRN

## 2020-08-08 MED ORDER — LACTATED RINGERS IV SOLN: 500.0000 mL | INTRAVENOUS | Status: DC | PRN

## 2020-08-08 MED ORDER — ALBUMIN HUMAN 5 % IV SOLN
12.5000 g | Freq: Once | INTRAVENOUS | Status: AC
  Administered 2020-08-08: 12.5 g via INTRAVENOUS
  Filled 2020-08-08: qty 250

## 2020-08-08 MED ORDER — OXYCODONE-ACETAMINOPHEN 5-325 MG PO TABS: 2.0000 | ORAL_TABLET | ORAL | Status: DC | PRN

## 2020-08-08 MED ORDER — OXYTOCIN BOLUS FROM INFUSION: 333.0000 mL | Freq: Once | INTRAVENOUS | Status: DC

## 2020-08-08 MED ORDER — ACETAMINOPHEN 500 MG PO TABS
1000.0000 mg | ORAL_TABLET | Freq: Four times a day (QID) | ORAL | Status: DC
  Administered 2020-08-08: 1000 mg via ORAL
  Filled 2020-08-08: qty 2

## 2020-08-08 MED ORDER — OXYTOCIN-SODIUM CHLORIDE 30-0.9 UT/500ML-% IV SOLN: 2.5000 [IU]/h | INTRAVENOUS | Status: AC

## 2020-08-08 MED ORDER — ACETAMINOPHEN 325 MG PO TABS: 650.0000 mg | ORAL_TABLET | ORAL | Status: DC | PRN

## 2020-08-08 MED ORDER — HYDROXYZINE HCL 50 MG PO TABS: 50.0000 mg | ORAL_TABLET | Freq: Four times a day (QID) | ORAL | Status: DC | PRN

## 2020-08-08 MED ORDER — MENTHOL 3 MG MT LOZG: 1.0000 | LOZENGE | OROMUCOSAL | Status: DC | PRN

## 2020-08-08 MED ORDER — ENOXAPARIN SODIUM 40 MG/0.4ML ~~LOC~~ SOLN
40.0000 mg | SUBCUTANEOUS | Status: DC
  Administered 2020-08-09 – 2020-08-10 (×2): 40 mg via SUBCUTANEOUS
  Filled 2020-08-08 (×2): qty 0.4

## 2020-08-08 MED ORDER — OXYCODONE HCL 5 MG PO TABS
5.0000 mg | ORAL_TABLET | ORAL | Status: DC | PRN
  Administered 2020-08-08: 5 mg via ORAL
  Filled 2020-08-08: qty 1

## 2020-08-08 MED ORDER — PHENYLEPHRINE HCL-NACL 20-0.9 MG/250ML-% IV SOLN
INTRAVENOUS | Status: AC
  Filled 2020-08-08: qty 250

## 2020-08-08 MED ORDER — FENTANYL CITRATE (PF) 100 MCG/2ML IJ SOLN
25.0000 ug | INTRAMUSCULAR | Status: DC | PRN
  Administered 2020-08-08: 50 ug via INTRAVENOUS

## 2020-08-08 MED ORDER — KETOROLAC TROMETHAMINE 30 MG/ML IJ SOLN
INTRAMUSCULAR | Status: DC | PRN
  Administered 2020-08-08: 30 mg via INTRAVENOUS

## 2020-08-08 MED ORDER — ALBUMIN HUMAN 5 % IV SOLN
INTRAVENOUS | Status: AC
  Filled 2020-08-08: qty 250

## 2020-08-08 MED ORDER — OXYTOCIN-SODIUM CHLORIDE 30-0.9 UT/500ML-% IV SOLN
1.0000 m[IU]/min | INTRAVENOUS | Status: DC
  Filled 2020-08-08: qty 500

## 2020-08-08 MED ORDER — CITALOPRAM HYDROBROMIDE 20 MG PO TABS
20.0000 mg | ORAL_TABLET | Freq: Every day | ORAL | Status: DC
  Administered 2020-08-08 – 2020-08-10 (×3): 20 mg via ORAL
  Filled 2020-08-08 (×3): qty 1

## 2020-08-08 MED ORDER — ONDANSETRON HCL 4 MG/2ML IJ SOLN: 4.0000 mg | Freq: Three times a day (TID) | INTRAMUSCULAR | Status: DC | PRN

## 2020-08-08 MED ORDER — DIPHENHYDRAMINE HCL 50 MG/ML IJ SOLN: 12.5000 mg | INTRAMUSCULAR | Status: DC | PRN

## 2020-08-08 MED ORDER — OXYTOCIN-SODIUM CHLORIDE 30-0.9 UT/500ML-% IV SOLN
INTRAVENOUS | Status: AC
  Filled 2020-08-08: qty 500

## 2020-08-08 MED ORDER — SIMETHICONE 80 MG PO CHEW
80.0000 mg | CHEWABLE_TABLET | Freq: Three times a day (TID) | ORAL | Status: DC
  Filled 2020-08-08: qty 1

## 2020-08-08 MED ORDER — NALOXONE HCL 4 MG/10ML IJ SOLN
1.0000 ug/kg/h | INTRAVENOUS | Status: DC | PRN
  Filled 2020-08-08: qty 5

## 2020-08-08 MED ORDER — SENNOSIDES-DOCUSATE SODIUM 8.6-50 MG PO TABS
2.0000 | ORAL_TABLET | ORAL | Status: DC
  Administered 2020-08-09 – 2020-08-10 (×2): 2 via ORAL
  Filled 2020-08-08 (×3): qty 2

## 2020-08-08 MED ORDER — SIMETHICONE 80 MG PO CHEW
80.0000 mg | CHEWABLE_TABLET | ORAL | Status: DC | PRN
  Administered 2020-08-09: 80 mg via ORAL
  Filled 2020-08-08: qty 1

## 2020-08-08 MED ORDER — SOD CITRATE-CITRIC ACID 500-334 MG/5ML PO SOLN: 30.0000 mL | ORAL | Status: DC | PRN

## 2020-08-08 MED ORDER — PHENYLEPHRINE HCL-NACL 20-0.9 MG/250ML-% IV SOLN
INTRAVENOUS | Status: DC | PRN
  Administered 2020-08-08: 60 ug/min via INTRAVENOUS

## 2020-08-08 MED ORDER — MORPHINE SULFATE (PF) 0.5 MG/ML IJ SOLN
INTRAMUSCULAR | Status: DC | PRN
  Administered 2020-08-08: 150 ug via INTRATHECAL

## 2020-08-08 MED ORDER — FENTANYL CITRATE (PF) 100 MCG/2ML IJ SOLN
INTRAMUSCULAR | Status: AC
  Filled 2020-08-08: qty 2

## 2020-08-08 MED ORDER — DIPHENHYDRAMINE HCL 25 MG PO CAPS: 25.0000 mg | ORAL_CAPSULE | Freq: Four times a day (QID) | ORAL | Status: DC | PRN

## 2020-08-08 MED ORDER — CEFAZOLIN SODIUM-DEXTROSE 2-3 GM-%(50ML) IV SOLR
INTRAVENOUS | Status: DC | PRN
  Administered 2020-08-08: 2 g via INTRAVENOUS

## 2020-08-08 MED ORDER — LACTATED RINGERS IV SOLN: INTRAVENOUS | Status: DC

## 2020-08-08 MED ORDER — DIBUCAINE (PERIANAL) 1 % EX OINT: 1.0000 "application " | TOPICAL_OINTMENT | CUTANEOUS | Status: DC | PRN

## 2020-08-08 MED ORDER — PRENATAL MULTIVITAMIN CH
1.0000 | ORAL_TABLET | Freq: Every day | ORAL | Status: DC
  Administered 2020-08-09: 1 via ORAL
  Filled 2020-08-08: qty 1

## 2020-08-08 MED ORDER — OXYCODONE-ACETAMINOPHEN 5-325 MG PO TABS: 1.0000 | ORAL_TABLET | ORAL | Status: DC | PRN

## 2020-08-08 MED ORDER — MORPHINE SULFATE (PF) 0.5 MG/ML IJ SOLN
INTRAMUSCULAR | Status: AC
  Filled 2020-08-08: qty 10

## 2020-08-08 MED ORDER — OXYTOCIN-SODIUM CHLORIDE 30-0.9 UT/500ML-% IV SOLN
2.5000 [IU]/h | INTRAVENOUS | Status: DC
  Administered 2020-08-08: 450 m[IU]/min via INTRAVENOUS

## 2020-08-08 MED ORDER — ACETAMINOPHEN 500 MG PO TABS
1000.0000 mg | ORAL_TABLET | Freq: Three times a day (TID) | ORAL | Status: DC
  Administered 2020-08-08 – 2020-08-10 (×5): 1000 mg via ORAL
  Filled 2020-08-08 (×5): qty 2

## 2020-08-08 MED ORDER — ALBUMIN HUMAN 5 % IV SOLN: INTRAVENOUS | Status: DC | PRN

## 2020-08-08 MED ORDER — HYDROMORPHONE HCL 1 MG/ML IJ SOLN
1.0000 mg | INTRAMUSCULAR | Status: DC | PRN
  Administered 2020-08-08: 1 mg via INTRAVENOUS
  Filled 2020-08-08: qty 1

## 2020-08-08 MED ORDER — NALOXONE HCL 0.4 MG/ML IJ SOLN: 0.4000 mg | INTRAMUSCULAR | Status: DC | PRN

## 2020-08-08 MED ORDER — NALBUPHINE HCL 10 MG/ML IJ SOLN: 5.0000 mg | INTRAMUSCULAR | Status: DC | PRN

## 2020-08-08 MED ORDER — FENTANYL CITRATE (PF) 100 MCG/2ML IJ SOLN
50.0000 ug | INTRAMUSCULAR | Status: DC | PRN
  Administered 2020-08-08: 100 ug via INTRAVENOUS
  Filled 2020-08-08 (×5): qty 2

## 2020-08-08 MED ORDER — KETOROLAC TROMETHAMINE 30 MG/ML IJ SOLN
30.0000 mg | Freq: Four times a day (QID) | INTRAMUSCULAR | Status: DC
  Administered 2020-08-08 – 2020-08-09 (×3): 30 mg via INTRAVENOUS
  Filled 2020-08-08 (×4): qty 1

## 2020-08-08 MED ORDER — KETOROLAC TROMETHAMINE 30 MG/ML IJ SOLN
INTRAMUSCULAR | Status: AC
  Filled 2020-08-08: qty 1

## 2020-08-08 MED ORDER — LACTATED RINGERS IV BOLUS: 1000.0000 mL | Freq: Once | INTRAVENOUS | Status: DC

## 2020-08-08 MED ORDER — TETANUS-DIPHTH-ACELL PERTUSSIS 5-2.5-18.5 LF-MCG/0.5 IM SUSY: 0.5000 mL | PREFILLED_SYRINGE | Freq: Once | INTRAMUSCULAR | Status: DC

## 2020-08-08 MED ORDER — LACTATED RINGERS IV SOLN: INTRAVENOUS | Status: DC | PRN

## 2020-08-08 MED ORDER — TERBUTALINE SULFATE 1 MG/ML IJ SOLN
0.2500 mg | Freq: Once | INTRAMUSCULAR | Status: AC | PRN
  Administered 2020-08-08: 0.25 mg via SUBCUTANEOUS
  Filled 2020-08-08: qty 1

## 2020-08-08 MED ORDER — NALBUPHINE HCL 10 MG/ML IJ SOLN
5.0000 mg | INTRAMUSCULAR | Status: DC | PRN
  Administered 2020-08-08 – 2020-08-09 (×3): 5 mg via INTRAVENOUS
  Filled 2020-08-08: qty 1

## 2020-08-08 MED ORDER — KETOROLAC TROMETHAMINE 30 MG/ML IJ SOLN: 30.0000 mg | Freq: Four times a day (QID) | INTRAMUSCULAR | Status: DC | PRN

## 2020-08-08 MED ORDER — IBUPROFEN 800 MG PO TABS
800.0000 mg | ORAL_TABLET | Freq: Three times a day (TID) | ORAL | Status: DC
  Administered 2020-08-09: 800 mg via ORAL
  Filled 2020-08-08: qty 1

## 2020-08-08 MED ORDER — SODIUM CHLORIDE 0.9 % IR SOLN
Status: DC | PRN
  Administered 2020-08-08: 1000 mL

## 2020-08-08 MED ORDER — LIDOCAINE HCL (PF) 1 % IJ SOLN: 30.0000 mL | INTRAMUSCULAR | Status: DC | PRN

## 2020-08-08 MED ORDER — SCOPOLAMINE 1 MG/3DAYS TD PT72: 1.0000 | MEDICATED_PATCH | Freq: Once | TRANSDERMAL | Status: DC

## 2020-08-08 MED ORDER — MEPERIDINE HCL 25 MG/ML IJ SOLN: 6.2500 mg | INTRAMUSCULAR | Status: DC | PRN

## 2020-08-08 MED ORDER — CITALOPRAM HYDROBROMIDE 20 MG PO TABS
20.0000 mg | ORAL_TABLET | Freq: Every day | ORAL | Status: DC
  Filled 2020-08-08: qty 1

## 2020-08-08 MED ORDER — ONDANSETRON HCL 4 MG/2ML IJ SOLN
4.0000 mg | Freq: Four times a day (QID) | INTRAMUSCULAR | Status: DC | PRN
  Administered 2020-08-08: 4 mg via INTRAVENOUS

## 2020-08-08 MED ORDER — FENTANYL CITRATE (PF) 100 MCG/2ML IJ SOLN
INTRAMUSCULAR | Status: DC | PRN
  Administered 2020-08-08: 15 ug via INTRATHECAL

## 2020-08-08 MED ORDER — SIMETHICONE 80 MG PO CHEW: 80.0000 mg | CHEWABLE_TABLET | ORAL | Status: DC

## 2020-08-08 MED ORDER — NALBUPHINE HCL 10 MG/ML IJ SOLN
5.0000 mg | Freq: Once | INTRAMUSCULAR | Status: DC | PRN
  Filled 2020-08-08 (×2): qty 1

## 2020-08-08 MED ORDER — COCONUT OIL OIL: 1.0000 "application " | TOPICAL_OIL | Status: DC | PRN

## 2020-08-08 MED ORDER — DIPHENHYDRAMINE HCL 25 MG PO CAPS: 25.0000 mg | ORAL_CAPSULE | ORAL | Status: DC | PRN

## 2020-08-08 MED ORDER — ONDANSETRON HCL 4 MG/2ML IJ SOLN
INTRAMUSCULAR | Status: DC | PRN
  Administered 2020-08-08: 4 mg via INTRAVENOUS

## 2020-08-08 MED ORDER — OXYTOCIN-SODIUM CHLORIDE 30-0.9 UT/500ML-% IV SOLN: 2.5000 [IU]/h | INTRAVENOUS | Status: DC

## 2020-08-08 MED ORDER — CEFAZOLIN SODIUM-DEXTROSE 2-4 GM/100ML-% IV SOLN: 2.0000 g | INTRAVENOUS | Status: AC

## 2020-08-08 MED ORDER — ZOLPIDEM TARTRATE 5 MG PO TABS: 5.0000 mg | ORAL_TABLET | Freq: Every evening | ORAL | Status: DC | PRN

## 2020-08-08 MED ORDER — BUPIVACAINE IN DEXTROSE 0.75-8.25 % IT SOLN
INTRATHECAL | Status: DC | PRN
  Administered 2020-08-08: 1.6 mL via INTRATHECAL

## 2020-08-08 MED ORDER — OXYCODONE HCL 5 MG PO TABS
10.0000 mg | ORAL_TABLET | ORAL | Status: DC | PRN
  Administered 2020-08-09 – 2020-08-10 (×6): 10 mg via ORAL
  Filled 2020-08-08 (×7): qty 2

## 2020-08-08 SURGICAL SUPPLY — 39 items
BENZOIN TINCTURE PRP APPL 2/3 (GAUZE/BANDAGES/DRESSINGS) ×2 IMPLANT
CHLORAPREP W/TINT 26ML (MISCELLANEOUS) ×2 IMPLANT
CLAMP CORD UMBIL (MISCELLANEOUS) IMPLANT
CLOTH BEACON ORANGE TIMEOUT ST (SAFETY) ×2 IMPLANT
DRAPE C SECTION CLR SCREEN (DRAPES) IMPLANT
DRSG OPSITE POSTOP 4X10 (GAUZE/BANDAGES/DRESSINGS) ×2 IMPLANT
ELECT REM PT RETURN 9FT ADLT (ELECTROSURGICAL) ×2
ELECTRODE REM PT RTRN 9FT ADLT (ELECTROSURGICAL) ×1 IMPLANT
EXTRACTOR VACUUM M CUP 4 TUBE (SUCTIONS) IMPLANT
GLOVE BIO SURGEON STRL SZ7.5 (GLOVE) ×2 IMPLANT
GLOVE BIOGEL PI IND STRL 7.0 (GLOVE) ×1 IMPLANT
GLOVE BIOGEL PI INDICATOR 7.0 (GLOVE) ×1
GOWN STRL REUS W/TWL 2XL LVL3 (GOWN DISPOSABLE) ×2 IMPLANT
GOWN STRL REUS W/TWL LRG LVL3 (GOWN DISPOSABLE) ×4 IMPLANT
KIT ABG SYR 3ML LUER SLIP (SYRINGE) IMPLANT
NEEDLE HYPO 22GX1.5 SAFETY (NEEDLE) ×2 IMPLANT
NEEDLE HYPO 25X5/8 SAFETYGLIDE (NEEDLE) IMPLANT
NS IRRIG 1000ML POUR BTL (IV SOLUTION) ×2 IMPLANT
PACK C SECTION WH (CUSTOM PROCEDURE TRAY) ×2 IMPLANT
PAD ABD DERMACEA PRESS 5X9 (GAUZE/BANDAGES/DRESSINGS) ×2 IMPLANT
PAD OB MATERNITY 4.3X12.25 (PERSONAL CARE ITEMS) ×2 IMPLANT
PENCIL SMOKE EVAC W/HOLSTER (ELECTROSURGICAL) ×2 IMPLANT
RTRCTR C-SECT PINK 25CM LRG (MISCELLANEOUS) ×2 IMPLANT
STRIP CLOSURE SKIN 1/2X4 (GAUZE/BANDAGES/DRESSINGS) ×2 IMPLANT
SUT CHROMIC 1 CTX 36 (SUTURE) ×4 IMPLANT
SUT PLAIN 0 NONE (SUTURE) IMPLANT
SUT VIC AB 1 CT1 36 (SUTURE) ×2 IMPLANT
SUT VIC AB 2-0 CT1 (SUTURE) ×2 IMPLANT
SUT VIC AB 2-0 CT1 27 (SUTURE) ×1
SUT VIC AB 2-0 CT1 TAPERPNT 27 (SUTURE) ×1 IMPLANT
SUT VIC AB 3-0 CT1 27 (SUTURE) ×1
SUT VIC AB 3-0 CT1 TAPERPNT 27 (SUTURE) ×1 IMPLANT
SUT VIC AB 3-0 SH 27 (SUTURE)
SUT VIC AB 3-0 SH 27X BRD (SUTURE) IMPLANT
SUT VIC AB 4-0 KS 27 (SUTURE) ×2 IMPLANT
SYR BULB IRRIGATION 50ML (SYRINGE) IMPLANT
TOWEL OR 17X24 6PK STRL BLUE (TOWEL DISPOSABLE) ×2 IMPLANT
TRAY FOLEY W/BAG SLVR 14FR LF (SET/KITS/TRAYS/PACK) ×2 IMPLANT
WATER STERILE IRR 1000ML POUR (IV SOLUTION) ×2 IMPLANT

## 2020-08-08 NOTE — Anesthesia Preprocedure Evaluation (Signed)
Anesthesia Evaluation  Patient identified by MRN, date of birth, ID band Patient awake    Reviewed: Allergy & Precautions, NPO status , Patient's Chart, lab work & pertinent test results  Airway Mallampati: II  TM Distance: >3 FB Neck ROM: Full    Dental   Pulmonary former smoker,    breath sounds clear to auscultation       Cardiovascular negative cardio ROS   Rhythm:Regular Rate:Normal     Neuro/Psych negative neurological ROS     GI/Hepatic negative GI ROS, Neg liver ROS,   Endo/Other  negative endocrine ROS  Renal/GU negative Renal ROS     Musculoskeletal   Abdominal   Peds  Hematology negative hematology ROS (+)   Anesthesia Other Findings   Reproductive/Obstetrics (+) Pregnancy                             Lab Results  Component Value Date   WBC 10.4 08/08/2020   HGB 12.0 08/08/2020   HCT 36.1 08/08/2020   MCV 96.8 08/08/2020   PLT 226 08/08/2020    Anesthesia Physical Anesthesia Plan  ASA: II and emergent  Anesthesia Plan: Spinal   Post-op Pain Management:    Induction:   PONV Risk Score and Plan: 2 and Treatment may vary due to age or medical condition, Dexamethasone and Ondansetron  Airway Management Planned: Natural Airway  Additional Equipment:   Intra-op Plan:   Post-operative Plan:   Informed Consent: I have reviewed the patients History and Physical, chart, labs and discussed the procedure including the risks, benefits and alternatives for the proposed anesthesia with the patient or authorized representative who has indicated his/her understanding and acceptance.       Plan Discussed with: CRNA  Anesthesia Plan Comments:         Anesthesia Quick Evaluation

## 2020-08-08 NOTE — Discharge Summary (Signed)
Postpartum Discharge Summary     Patient Name: Catherine Carpenter DOB: August 02, 1986 MRN: [id number]  Date of admission: 08/07/2020 Delivery date:08/08/2020  08/08/2020 Delivering provider: Alben Spittle, MD  Date of discharge: 08/10/2020  Admitting diagnosis: Normal labor [O80, Z37.9] Encounter for elective induction of labor [Z34.90] Intrauterine pregnancy: [redacted]w[redacted]d    Secondary diagnosis:  Active Problems:   Panic disorder   History of loop electrical excision procedure (LEEP)   History of preterm delivery, currently pregnant   History of postpartum depression, currently pregnant   Abnormal MSAFP (maternal serum alpha-fetoprotein), elevated   Encounter for elective induction of labor   PPH (postpartum hemorrhage)   Cesarean delivery delivered  Additional problems: as noted above    Discharge diagnosis: Cesarean delivery delivered                                            Post partum procedures:blood transfusion and IV iron (venofer) Augmentation: N/A Complications: Face presentation, mentum posterior   Hospital course: Onset of Labor With Unplanned C/S   34y.o. yo [id number] admitted in Active Labor on 08/07/2020. Patient had a labor course significant for normal labor progress, but at 7-8cm was found to have a face presentation with mentum posterior. The patient went for cesarean section due to Malpresentation. Delivery details as follows: Membrane Rupture Time/Date: 5:56 AM ,08/08/2020   Delivery Method:C-Section, Low Transverse  Details of operation can be found in separate operative note. Patient had an uncomplicated postpartum course.  She is ambulating,tolerating a regular diet, passing flatus, and urinating well.  Patient is discharged home in stable condition 08/10/20.  Newborn Data: Birth date:08/08/2020  Birth time:10:16 AM  Gender:Female  Living status:Living  Apgars:8 ,9  Weight:3375 g   Magnesium Sulfate received: No BMZ received:  No Rhophylac:N/A MMR:N/A T-DaP:Given prenatally Flu: Yes  Given prenatally Transfusion:Yes (1u pRBCs + IV iron)  Physical exam  Vitals:   08/09/20 0910 08/09/20 1404 08/09/20 2117 08/10/20 0502  BP: (!) 96/48 (!) 111/55 (!) 106/57 (!) 104/58  Pulse: 78 85 82 85  Resp: 17 16 17 17   Temp: 99.2 F (37.3 C) 99.1 F (37.3 C) 99.7 F (37.6 C) 98.3 F (36.8 C)  TempSrc: Oral Oral Oral Oral  SpO2: 96% 97% 96% 97%   General: alert, cooperative and no distress Lochia: appropriate Uterine Fundus: firm Incision: Healing well with no significant drainage, No significant erythema, Dressing is clean, dry, and intact DVT Evaluation: No evidence of DVT seen on physical exam. Labs: Lab Results  Component Value Date   WBC 12.3 (H) 08/09/2020   HGB 7.8 (L) 08/09/2020   HCT 23.1 (L) 08/09/2020   MCV 95.9 08/09/2020   PLT 166 08/09/2020   CMP Latest Ref Rng & Units 08/08/2020  Glucose 65 - 99 mg/dL -  BUN 6 - 20 mg/dL -  Creatinine 0.44 - 1.00 mg/dL 0.62  Sodium 134 - 144 mmol/L -  Potassium 3.5 - 5.2 mmol/L -  Chloride 96 - 106 mmol/L -  CO2 20 - 29 mmol/L -  Calcium 8.7 - 10.2 mg/dL -  Total Protein 6.0 - 8.5 g/dL -  Total Bilirubin 0.0 - 1.2 mg/dL -  Alkaline Phos 39 - 117 IU/L -  AST 0 - 40 IU/L -  ALT 0 - 32 IU/L -   Edinburgh Score: Edinburgh Postnatal Depression  Scale Screening Tool 08/09/2020  I have been able to laugh and see the funny side of things. 0  I have looked forward with enjoyment to things. 0  I have blamed myself unnecessarily when things went wrong. 0  I have been anxious or worried for no good reason. 0  I have felt scared or panicky for no good reason. 0  Things have been getting on top of me. 1  I have been so unhappy that I have had difficulty sleeping. 0  I have felt sad or miserable. 0  I have been so unhappy that I have been crying. 0  The thought of harming myself has occurred to me. 0  Edinburgh Postnatal Depression Scale Total 1     After  visit meds:  Allergies as of 08/10/2020      Reactions   Bee Venom Anaphylaxis, Hives   Penicillins Hives   Did it involve swelling of the face/tongue/throat, SOB, or low BP? No Did it involve sudden or severe rash/hives, skin peeling, or any reaction on the inside of your mouth or nose? No Did you need to seek medical attention at a hospital or doctor's office? No When did it last happen? If all above answers are NO, may proceed with cephalosporin use.      Medication List    STOP taking these medications   Comfort Fit Maternity Supp Med Misc   doxylamine (Sleep) 25 MG tablet Commonly known as: UNISOM     TAKE these medications   acetaminophen 500 MG tablet Commonly known as: TYLENOL Take 2 tablets (1,000 mg total) by mouth every 8 (eight) hours. What changed:   medication strength  how much to take  when to take this  reasons to take this   calcium carbonate 500 MG chewable tablet Commonly known as: TUMS - dosed in mg elemental calcium Chew 2 tablets by mouth as needed for indigestion or heartburn.   citalopram 20 MG tablet Commonly known as: CELEXA citalopram 20 mg tablet  TAKE 1 TABLET BY MOUTH EVERY DAY   coconut oil Oil Apply 1 application topically as needed (nipple pain).   ibuprofen 800 MG tablet Commonly known as: ADVIL Take 1 tablet (800 mg total) by mouth every 8 (eight) hours.   multivitamin-prenatal 27-0.8 MG Tabs tablet Take 1 tablet by mouth daily at 12 noon.   oxyCODONE 5 MG immediate release tablet Commonly known as: Oxy IR/ROXICODONE Take 1 tablet (5 mg total) by mouth every 4 (four) hours as needed for moderate pain.   PROBIOTIC ACIDOPHILUS PO Take by mouth.   senna-docusate 8.6-50 MG tablet Commonly known as: Senokot-S Take 2 tablets by mouth daily. Start taking on: August 11, 2020        Discharge home in stable condition Infant Feeding: Breast Infant Disposition:home with mother Discharge instruction: per After  Visit Summary and Postpartum booklet. Activity: Advance as tolerated. Pelvic rest for 6 weeks.  Diet: routine diet Future Appointments: Future Appointments  Date Time Provider [place]  08/16/2020 10:00 AM [facility] NURSE [facility]  09/06/2020  9:15 AM Donnamae Jude, MD [facility]   Follow up Visit: as noted above  Please schedule this patient for a Virtual postpartum visit in 4 weeks with the following provider: Any provider. Additional Postpartum F/U:Incision check 1 week  High risk pregnancy complicated by: hx of LEEP and SVT, Cesarean secondary to fetal malpresentation (mentum posterior)  Delivery mode:  C-Section, Low Transverse LTCS  Anticipated Birth Control:  vasectomy  Randa Ngo, MD OB Fellow, Faculty Practice 08/10/2020 5:47 AM

## 2020-08-08 NOTE — Plan of Care (Signed)
  Problem: Activity: Goal: Ability to tolerate increased activity will improve Note: Discussed the benefits of early ambulation with patient. Patient has just finished eating her first meal; however, declines getting out of bed at this time, stating that she would like to try to get a little sleep for now because she has not been able to rest at all since delivery and is very tired. Encouraged patient to continue to drink plenty of fluids and will attempt ambulation in the next few hours. Earl Gala, Linda Hedges [place]

## 2020-08-08 NOTE — Anesthesia Procedure Notes (Signed)
Spinal  Patient location during procedure: OR Start time: 08/08/2020 9:37 AM End time: 08/08/2020 9:42 AM Staffing Performed: anesthesiologist  Anesthesiologist: Marcene Duos, MD Preanesthetic Checklist Completed: patient identified, IV checked, site marked, risks and benefits discussed, surgical consent, monitors and equipment checked, pre-op evaluation and timeout performed Spinal Block Patient position: sitting Prep: DuraPrep Patient monitoring: heart rate, cardiac monitor, continuous pulse ox and blood pressure Approach: midline Location: L3-4 Injection technique: single-shot Needle Needle type: Pencan  Needle gauge: 24 G Needle length: 9 cm Assessment Sensory level: T4

## 2020-08-08 NOTE — H&P (Addendum)
OBSTETRIC ADMISSION HISTORY AND PHYSICAL  Catherine Carpenter is a 34 y.o. female [phone] with IUP at [redacted]w[redacted]d by LMP presenting for SOL. She reports +FMs, + LOF, no VB, no blurry vision, headaches or peripheral edema, and RUQ pain.  She plans on breast feeding. She is considering vasectomy for birth control.  She received her prenatal care at [facility]   Dating: By LMP --->  Estimated Date of Delivery: 08/16/20  Sono:  @[redacted]w[redacted]d , CWD, normal anatomy, cephalic presentation, 3096g, EFW   Prenatal History/Complications:  -[redacted]w[redacted]d gestation of pregnancy -abnormal MSAFP -hx of LEEP -hx of preterm labor  -hx of postpartum depression -low lying placenta on [redacted]w[redacted]d (resolved)  Past Medical History: Past Medical History:  Diagnosis Date   History of rheumatic fever as a child    Panic disorder    Paroxysmal SVT (supraventricular tachycardia) (HCC)    Postpartum depression    Preterm labor    Vaginal Pap smear, abnormal    LEEP 2014    Past Surgical History: Past Surgical History:  Procedure Laterality Date   CARDIAC ELECTROPHYSIOLOGY STUDY AND ABLATION     COLPOSCOPY     INDUCED ABORTION     LEEP  2014   WISDOM TOOTH EXTRACTION      Obstetrical History: OB History     Gravida  4   Para  2   Term  1   Preterm  1   AB  1   Living  2      SAB      TAB  1   Ectopic      Multiple  0   Live Births  2           Social History Social History   Socioeconomic History   Marital status: Single    Spouse name: Not on file   Number of children: Not on file   Years of education: Not on file   Highest education level: Not on file  Occupational History    Comment: unemployed  Tobacco Use   Smoking status: Former Smoker    Packs/day: 0.50    Types: Cigarettes    Quit date: 08/04/2018    Years since quitting: 2.0   Smokeless tobacco: Never Used  Vaping Use   Vaping Use: Former   Quit date: 05/27/2020   Substances: Flavoring   Devices: Vuse   Substance and Sexual Activity    Alcohol use: Not Currently    Alcohol/week: 4.0 standard drinks    Types: 4 Glasses of wine per week    Comment: socially   Drug use: Never   Sexual activity: Yes    Partners: Male    Birth control/protection: None  Other Topics Concern   Not on file  Social History Narrative   Not on file   Social Determinants of Health   Financial Resource Strain:    Difficulty of Paying Living Expenses: Not on file  Food Insecurity: No Food Insecurity   Worried About Running Out of Food in the Last Year: Never true   Ran Out of Food in the Last Year: Never true  Transportation Needs: No Transportation Needs   Lack of Transportation (Medical): No   Lack of Transportation (Non-Medical): No  Physical Activity:    Days of Exercise per Week: Not on file   Minutes of Exercise per Session: Not on file  Stress:    Feeling of Stress : Not on file  Social Connections:    Frequency of Communication with Friends  and Family: Not on file   Frequency of Social Gatherings with Friends and Family: Not on file   Attends Religious Services: Not on file   Active Member of Clubs or Organizations: Not on file   Attends Banker Meetings: Not on file   Marital Status: Not on file    Family History: Family History  Problem Relation Age of Onset   Thyroid disease Mother    Depression Mother    Diabetes Mother        Hypoglycemia   Depression Father    Hypertension Father    Hyperlipidemia Father    Diabetes Father    Depression Sister    Anxiety disorder Sister    Epilepsy Sister    Fibroids Sister    Seizures Sister    Fibromyalgia Sister    Anxiety disorder Brother    Depression Brother    Depression Brother    Anxiety disorder Brother    Anxiety disorder Brother    Depression Brother    Diabetes Paternal Grandmother     Allergies: Allergies  Allergen Reactions   Bee Venom Anaphylaxis and Hives   Penicillins Hives    Did it involve swelling of the face/tongue/throat,  SOB, or low BP? No Did it involve sudden or severe rash/hives, skin peeling, or any reaction on the inside of your mouth or nose? No Did you need to seek medical attention at a hospital or doctor's office? No When did it last happen?       If all above answers are "NO", may proceed with cephalosporin use.    Medications Prior to Admission  Medication Sig Dispense Refill Last Dose   acetaminophen (TYLENOL) 325 MG tablet Take 2 tablets (650 mg total) by mouth every 4 (four) hours as needed (for pain scale < 4). 30 tablet 0 Past Week at Unknown time   calcium carbonate (TUMS - DOSED IN MG ELEMENTAL CALCIUM) 500 MG chewable tablet Chew 2 tablets by mouth as needed for indigestion or heartburn.   08/08/2020 at Unknown time   citalopram (CELEXA) 20 MG tablet citalopram 20 mg tablet  TAKE 1 TABLET BY MOUTH EVERY DAY   08/08/2020 at Unknown time   doxylamine, Sleep, (UNISOM) 25 MG tablet Take 1 tablet (25 mg total) by mouth at bedtime as needed (nausea and vomiting). 30 tablet 2 Past Week at Unknown time   Elastic Bandages & Supports (COMFORT FIT MATERNITY SUPP MED) MISC 1 Device by Does not apply route daily as needed. 1 each 0 Past Week at Unknown time   Lactobacillus (PROBIOTIC ACIDOPHILUS PO) Take by mouth.   Past Week at Unknown time   Prenatal Vit-Fe Fumarate-FA (MULTIVITAMIN-PRENATAL) 27-0.8 MG TABS tablet Take 1 tablet by mouth daily at 12 noon.   08/08/2020 at Unknown time     Review of Systems   All systems reviewed and negative except as stated in HPI  Last menstrual period 11/10/2019, unknown if currently breastfeeding. General appearance: alert, cooperative and no distress Chest: normal respiratory effort Abdomen: soft, non-tender; gravid Extremities: no LE edema noted bilaterally, no sign of DVT Presentation: cephalic Fetal monitoringBaseline: 125 bpm, Variability: Good {> 6 bpm), Accelerations: Reactive and Decelerations: Absent Uterine activity: intermittent contractions   Dilation: 5 Effacement (%): 80 Station: -2 Exam by:: J.Bellamy,RN   Prenatal labs: ABO, Rh: --/--/O POS (11/17 0410) Antibody: NEG (11/17 0410) Rubella: 4.30 (06/14 1551) RPR: NON REACTIVE (11/17 0410)  HBsAg: Negative (06/14 1551)  HIV: Non Reactive (09/13 1005)  GBS:  negative (07/21/2020) 1 hr Glucola: 158 (05/17/2020) Genetic screening: normal (02/16/2020) Anatomy US normal (07/23/2020)  Prenatal Transfer Tool  Maternal Diabetes: No Genetic Screening: Normal Maternal Ultrasounds/Referrals: Normal Fetal Ultrasounds or other Referrals:  Referred to Materal Fetal Medicine  Maternal Substance Abuse:  No Significant Maternal Medications:  Meds include: Other: citalopram Significant Maternal Lab Results: Group B Strep negative  No results found for this or any previous visit (from the past 24 hour(s)).  Patient Active Problem List   Diagnosis Date Noted   Nausea and vomiting during pregnancy 07/23/2020   Preterm labor in third trimester 07/21/2020   Preterm labor 07/21/2020   Abnormal MSAFP (maternal serum alpha-fetoprotein), elevated 06/03/2020   Supervision of high risk pregnancy, antepartum 02/09/2020   History of preterm delivery, currently pregnant 02/09/2020   History of postpartum depression, currently pregnant 02/09/2020   History of loop electrical excision procedure (LEEP) 11/22/2018   Rheumatic fever 03/08/2018   Paroxysmal SVT (supraventricular tachycardia) (HCC)    Panic disorder    History of rheumatic fever as a child     Assessment/Plan:  Catherine Carpenter is a 34 y.o. [id number] at [redacted]w[redacted]d here for SOL.  #Labor: Progressing well, will consider pitocin as appropriate. #Pain: fentanyl prn #FWB: Category 1 strip #ID: GBS negative #MOF: breast #MOC: vasectomy #Circ: yes #Elevated MSAFP #low lying placenta: resolved as of [place] on 07/23/2020 #hx. Postpartum depression: Home med includes citalopram, continued. Consider SW consult following delivery. Plan for 1  week mood check.  Reece Leader, DO  08/08/2020, 12:34 AM  Attestation of Supervision of Student:  I confirm that I have verified the information documented in the  resident's  note and that I have also personally reperformed the history, physical exam and all medical decision making activities.  I have verified that all services and findings are accurately documented in this student's note; and I agree with management and plan as outlined in the documentation. I have also made any necessary editorial changes.  Sheila Oats, MD Center for [facility], [facility] 08/08/2020 5:10 AM

## 2020-08-08 NOTE — Lactation Note (Addendum)
This note was copied from a baby's chart. Lactation Consultation Note  Patient Name: Catherine Carpenter WRUEA'V Date: 08/08/2020 Reason for consult: Initial assessment;Early term 37-38.6wks P3, 7 hour ETI female infant. Mom with hx: panic disorder- meds are citalopram and klonopin, mom with C/S and close spaced pregnancies.  Per mom, she didn't BF her 1st child, her 2nd child she breastfeed for 6 months, she started milk sharing ( EBM) with her friend who gave her breast milk due her supply deceasing when she returned to work. 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  Per mom, infant latched well in L&D but not on MBU been sleepy. Mom was asking LC about infant tongue it has heart shape and upper gum has lingual frenulum, LC suggested parents discussed this with infant's  Pediatrician.  Mom hand expressed 4 mls of colostrum that was given to infant by spoon, infant became more alert afterwards, mom attempted to latch infant on her right breast using the football hold position, infant only held breast in mouth was not eliciting the suck and swallow response. LC gave mom hand pump and demonstrated how to use, mom was fitted with 27 mm breast flange, and easily expressed 4 mls of colostrum from using the hand pump that was given to infant by spoon. Infant's total volume of EBM this feeding was 8 mls. Afterwards infant was little spitty small mucous that was clear, LC burped infant and dad took infant to swaddle him.   Mom will continue to work towards latching infant at breast, do a lot of STS, mom will hand express or use hand pump and give infant back volume if he doesn't latch at the breast 5-10 mls of her EBM each  feeding. Mom knows to breastfeed infant according to cues, 8 to 12+ times within 24 hours. Mom knows to call RN or LC if she has questions, concerns or needs assistance with latching infant at the breast. Parents will continue to do as much STS with infant as possible. Mom made aware of O/P services, breastfeeding support groups, community resources, and our phone # for post-discharge questions.  Maternal Data Formula Feeding for Exclusion: No Has patient been taught Hand Expression?: Yes Does the patient have breastfeeding experience prior to this delivery?: Yes  Feeding Feeding Type: Breast Fed  LATCH Score Latch: Too sleepy or reluctant, no latch achieved, no sucking elicited.  Audible Swallowing: None  Type of Nipple: Everted at rest and after stimulation  Comfort (Breast/Nipple): Soft / non-tender  Hold (Positioning): Assistance needed to correctly position infant at breast and maintain latch.  LATCH Score: 5  Interventions Interventions: Breast feeding basics reviewed;Assisted with latch;Breast compression;Skin to skin;Adjust position;Breast massage;Support pillows;Hand express;Position options;Expressed milk;DEBP;Hand pump  Lactation Tools Discussed/Used Tools: Pump WIC Program: No Pump Review: Setup, frequency, and cleaning;Milk Storage Initiated by:: Danelle Earthly, IBCLC Date initiated:: 08/08/20   Consult Status Consult Status: Follow-up Date: 08/09/20 Follow-up type: In-patient    Danelle Earthly 08/08/2020, 5:49 PM

## 2020-08-08 NOTE — Progress Notes (Signed)
Spoke to Dr, Reece Leader about Pt's breakthrough pain. Confirmed with the doctor that it is ok to administer 5mg  oxycodone PO with 1000mg  tylenol PO before first  12 hour window post C/S.

## 2020-08-08 NOTE — Transfer of Care (Signed)
Immediate Anesthesia Transfer of Care Note  Patient: Catherine Carpenter  Procedure(s) Performed: CESAREAN SECTION (N/A )  Patient Location: PACU  Anesthesia Type:Spinal  Level of Consciousness: awake, alert , oriented and patient cooperative  Airway & Oxygen Therapy: Patient Spontanous Breathing  Post-op Assessment: Report given to RN and Post -op Vital signs reviewed and stable  Post vital signs: Reviewed and stable  Last Vitals:  Vitals Value Taken Time  BP 88/55 08/08/20 1120  Temp 35.6 C 08/08/20 1120  Pulse 84 08/08/20 1128  Resp 22 08/08/20 1128  SpO2 100 % 08/08/20 1128  Vitals shown include unvalidated device data.  Last Pain:  Vitals:   08/08/20 1120  TempSrc: Axillary  PainSc:          Complications: No complications documented.

## 2020-08-08 NOTE — Anesthesia Postprocedure Evaluation (Signed)
Anesthesia Post Note  Patient: Catherine Carpenter  Procedure(s) Performed: CESAREAN SECTION (N/A )     Patient location during evaluation: PACU Anesthesia Type: Spinal Level of consciousness: awake and alert Pain management: pain level controlled Vital Signs Assessment: post-procedure vital signs reviewed and stable Respiratory status: spontaneous breathing and respiratory function stable Cardiovascular status: blood pressure returned to baseline and stable Postop Assessment: spinal receding Anesthetic complications: no   No complications documented.  Last Vitals:  Vitals:   08/08/20 1330 08/08/20 1358  BP: (!) 97/50 (!) 94/52  Pulse: 60 60  Resp: 12 16  Temp:  36.6 C  SpO2: 97%     Last Pain:  Vitals:   08/08/20 1358  TempSrc: Oral  PainSc:    Pain Goal:                   [name]

## 2020-08-08 NOTE — Progress Notes (Signed)
Patient ID: Catherine Carpenter, female   DOB: March 02, 1986, 34 y.o.   MRN: [id number] [facility] Attending Pt admitted with SOL. Question of face presentation.  SVE: OMP face presentation confirmed  A/P Malpresentation  The risks of cesarean section discussed with the patient included but were not limited to: bleeding which may require transfusion or reoperation; infection which may require antibiotics; injury to bowel, bladder, ureters or other surrounding organs; injury to the fetus; need for additional procedures including hysterectomy in the event of a life-threatening hemorrhage; placental abnormalities wth subsequent pregnancies, incisional problems, thromboembolic phenomenon and other postoperative/anesthesia complications. The patient concurred with the proposed plan, giving informed written consent for the procedure.   Anesthesia and OR aware. Preoperative prophylactic antibiotics and SCDs ordered on call to the OR.  To OR when ready.  Eilleen Davoli L. Alysia Penna, MD

## 2020-08-08 NOTE — Op Note (Signed)
Cesarean Section Procedure Note  08/08/2020  11:07 AM  PATIENT:  Catherine Carpenter  34 y.o. female  PRE-OPERATIVE DIAGNOSIS:  Primary Cesarean section for malpresentation  POST-OPERATIVE DIAGNOSIS:  Primary Cesarean Section for Malpresentation  PROCEDURE:  Procedure(s): CESAREAN SECTION (N/A)  SURGEON:  Surgeon(s) and Role:     Hermina Staggers, MD - Primary  ASSISTANTS: none   ANESTHESIA:   spinal  EBL:  Total I/O In: 3000 [I.V.:3000] Out: 2064 [Urine:200; Blood:1864]  BLOOD ADMINISTERED:none  DRAINS: none   LOCAL MEDICATIONS USED:  NONE  SPECIMEN:  No Specimen  DISPOSITION OF SPECIMEN:  Labor & Delivery   Procedure Details   The patient was seen in the Holding Room. The risks, benefits, complications, treatment options, and expected outcomes were discussed with the patient.  The patient concurred with the proposed plan, giving informed consent.  The site of surgery properly noted/marked. The patient was taken to Operating Room # B, identified as Catherine Carpenter and the procedure verified as C-Section Delivery. A Time Out was held and the above information confirmed.  After induction of anesthesia, the patient was draped and prepped in the usual sterile manner. A Pfannenstiel incision was made and carried down through the subcutaneous tissue to the fascia. Fascial incision was made and extended transversely. The fascia was separated from the underlying rectus tissue superiorly and inferiorly. The peritoneum was identified and entered. Peritoneal incision was extended longitudinally. The utero-vesical peritoneal reflection was incised transversely and the bladder flap was bluntly freed from the lower uterine segment. A low transverse uterine incision was made. Delivered from cephalic presentation was a viable female infant. With wr and Apgars as recorded. After the umbilical cord was clamped and cut cord blood was obtained for evaluation. The placenta was removed intact and appeared  normal. The uterine outline, tubes and ovaries appeared normal. The uterine incision was closed with running locked sutures of Chromic, in a 2 layered fashion. Hemostasis was observed. Lavage was carried out until clear. The peritoneum and muscle were closed with 2/0 Vicryl. The fascia was then reapproximated with running sutures of Vicryl. The skin was reapproximated with Vicryl.  Instrument, sponge, and needle counts were correct prior the abdominal closure and at the conclusion of the case.   Complications:  None; patient tolerated the procedure well.  COUNTS:  YES  PLAN OF CARE: Admit to inpatient   PATIENT DISPOSITION:  PACU - hemodynamically stable.   Delay start of Pharmacological VTE agent (>24hrs) due to surgical blood loss or risk of bleeding: not applicable             Disposition: PACU - hemodynamically stable.         Condition: stable   Hermina Staggers, MD 08/08/2020 11:07 AM

## 2020-08-09 ENCOUNTER — Inpatient Hospital Stay (HOSPITAL_COMMUNITY)
Admission: AD | Admit: 2020-08-09 | Payer: Medicaid Other | Source: Home / Self Care | Admitting: Obstetrics and Gynecology

## 2020-08-09 ENCOUNTER — Inpatient Hospital Stay (HOSPITAL_COMMUNITY): Payer: Medicaid Other

## 2020-08-09 ENCOUNTER — Encounter: Payer: Self-pay | Admitting: *Deleted

## 2020-08-09 LAB — CBC
HCT: 24 % — ABNORMAL LOW (ref 36.0–46.0)
HCT: 23.1 % — ABNORMAL LOW (ref 36.0–46.0)
Hemoglobin: 7.7 g/dL — ABNORMAL LOW (ref 12.0–15.0)
Hemoglobin: 7.8 g/dL — ABNORMAL LOW (ref 12.0–15.0)
MCH: 32.1 pg (ref 26.0–34.0)
MCH: 32.4 pg (ref 26.0–34.0)
MCHC: 32.1 g/dL (ref 30.0–36.0)
MCHC: 33.8 g/dL (ref 30.0–36.0)
MCV: 100 fL (ref 80.0–100.0)
MCV: 95.9 fL (ref 80.0–100.0)
Platelets: 180 10*3/uL (ref 150–400)
Platelets: 166 10*3/uL (ref 150–400)
RBC: 2.4 MIL/uL — ABNORMAL LOW (ref 3.87–5.11)
RBC: 2.41 MIL/uL — ABNORMAL LOW (ref 3.87–5.11)
RDW: 12.7 % (ref 11.5–15.5)
RDW: 14.1 % (ref 11.5–15.5)
WBC: 10.5 10*3/uL (ref 4.0–10.5)
WBC: 12.3 10*3/uL — ABNORMAL HIGH (ref 4.0–10.5)
nRBC: 0 % (ref 0.0–0.2)
nRBC: 0 % (ref 0.0–0.2)

## 2020-08-09 LAB — PREPARE RBC (CROSSMATCH)

## 2020-08-09 MED ORDER — FERROUS SULFATE 325 (65 FE) MG PO TABS
325.0000 mg | ORAL_TABLET | ORAL | Status: DC
  Administered 2020-08-09: 325 mg via ORAL
  Filled 2020-08-09: qty 1

## 2020-08-09 MED ORDER — IBUPROFEN 800 MG PO TABS
800.0000 mg | ORAL_TABLET | Freq: Three times a day (TID) | ORAL | Status: DC
  Administered 2020-08-09: 800 mg via ORAL
  Filled 2020-08-09: qty 1

## 2020-08-09 MED ORDER — SODIUM CHLORIDE 0.9 % IV SOLN
500.0000 mg | Freq: Once | INTRAVENOUS | Status: AC
  Administered 2020-08-09: 500 mg via INTRAVENOUS
  Filled 2020-08-09: qty 25

## 2020-08-09 MED ORDER — SODIUM CHLORIDE 0.9% IV SOLUTION: Freq: Once | INTRAVENOUS | Status: AC

## 2020-08-09 MED ORDER — IBUPROFEN 800 MG PO TABS
800.0000 mg | ORAL_TABLET | Freq: Three times a day (TID) | ORAL | Status: DC
  Administered 2020-08-10 (×2): 800 mg via ORAL
  Filled 2020-08-09 (×2): qty 1

## 2020-08-09 NOTE — Lactation Note (Signed)
This note was copied from a baby's chart. Lactation Consultation Note  Patient Name: Catherine Carpenter PXTGG'Y Date: 08/09/2020 Reason for consult: Follow-up assessment;Mother's request;Early term 37-38.6wks;Infant weight loss  Infant is 38 weeks 31 hours old. Mom experienced with breastfeeding. She just had questions if infant was getting enough since he was cluster feeding. Last feeding was at 3:15 pm today infant latched for 30 minutes.   Infant had 5 urine and 3 stool since birth.   LC assisted Mom latching infant in cross cradle, helping her to align the baby tummy to tummy, flanging the lips and using an chin tug to keep bottom lip out. Mom stated latch was more comfortable. Infant latched showing signs of milk transfer for 6 minutes and still feeding at the end of the visit.   LC examined Mom's breast and she had a small abrasion on the left side. Mom given coconut oil by RN for nipple care. Mom states she prefers lanolin cream and aware of the risks with using it.   LC provided Mom with comfort gels for nipple care. Mom is aware to rinse them off after use and to discard them after 6 days. Mom is aware not to use the comfort gels with coconut oil.   Plan 1. To feed infant based on cues 8-12x in 24 hour period no more than 4 hours without an attempt.          2. Mom to offer both breasts, STS and look for signs of milk transfer.           3. Mom taught hand expression and spoon feeding earlier and will use it to if she is unable to get infant to latch at the breasts.          4. All questions answered at the end of the visit.

## 2020-08-09 NOTE — Progress Notes (Addendum)
POSTPARTUM PROGRESS NOTE  Subjective: Catherine Carpenter is a 34 y.o. [phone] s/p LTCS at [redacted]w[redacted]d.  She reports she doing well. No acute events overnight. She denies any problems with ambulating, voiding or po intake. Denies nausea or vomiting. She has passed flatus. Pain is well controlled.  Lochia is mild without clotting.  Objective: Blood pressure (!) 105/48, pulse 88, temperature 99.6 F (37.6 C), temperature source Oral, resp. rate 16, last menstrual period 11/10/2019, SpO2 97 %, unknown if currently breastfeeding.  Physical Exam:  General: alert, cooperative and no distress Chest: no respiratory distress Abdomen: soft, mild tenderness, incision site covered with clean and dry dressing, no saturations noted Uterine Fundus: firm and at level of umbilicus Extremities: No calf swelling or tenderness, no LE edema noted bilaterally   Recent Labs    08/08/20 1213 08/09/20 0419  HGB 8.2* 7.7*  HCT 24.5* 24.0*    Assessment/Plan: Catherine Carpenter is a 34 y.o. [id number] s/p LTCS at [redacted]w[redacted]d for SOL.  Routine Postpartum Care: Doing well, pain well-controlled.  -- Continue routine care, lactation support  -- Contraception: vasectomy -- Feeding: breast --Hx of postpartum depression: Home med includes citalopram 20 mg daily which was continued during hospitalization. SW consult placed. Plan for 1 week mood check. --Anemia: Patient asymptomatic. Hgb 12>8.2>7.7. Discussed with patient. Ordered 1unit pRBC and oral iron given pt with symptoms and breastfeeding.  Dispo: Plan for discharge in 1-2 days.  Reece Leader, DO 08/09/2020 7:01 AM   Attestation of Supervision of Student:  I confirm that I have verified the information documented in the  resident's  note and that I have also personally reperformed the history, physical exam and all medical decision making activities.  I have verified that all services and findings are accurately documented in this student's note; and I agree with management and plan  as outlined in the documentation. I have also made any necessary editorial changes.  Sheila Oats, MD Center for [facility], [facility] 08/09/2020 7:27 AM

## 2020-08-09 NOTE — Social Work (Addendum)
CSW received consult for hx of Anxiety and Postpartum Depression.  CSW met with MOB to offer support and complete assessment.     CSW introduced self and role. CSW observed MOB in bed holding newborn Karlene Lineman Hudgins. CSW informed MOB of reason for consult. MOB expressed understanding. MOB was pleasant and observed smiling throughout the assessment. MOB reported she experienced PPD after the birth of her last child 14 months ago. MOB stated she started Celexa to treat the depression. MOB reported she was diagnosed with panic attacks in 2013 and currently takes Klonopin to treat it. MOB expressed the medications are helpful. MOB stated she has never attended therapy to treat either diagnosis. FOB entered the room during the assessment and MOB stated he could remain in the room. MOB identified FOB and his family as supports. MOB stated her family lives in [place]. MOB denies any current SI or HI.   CSW provided education regarding the baby blues period vs. perinatal mood disorders, discussed treatment and gave resources for mental health follow up if concerns arise.  CSW recommends self-evaluation during the postpartum time period using the New Mom Checklist from Postpartum Progress and encouraged MOB to contact a medical professional if symptoms are noted at any time.   CSW provided review of Sudden Infant Death Syndrome (SIDS) precautions.  MOB reported newborn will sleep in a bassinet. MOB has identified [facility] for follow-up care and denies any transportation barriers. MOB stated she has all of the essential needs for newborn, including a car seat. MOB declined any additional resources or referrals.  CSW identifies no further need for intervention and no barriers to discharge at this time.  Darra Lis, [employer] [phone]

## 2020-08-10 LAB — TYPE AND SCREEN
ABO/RH(D): O POS
Antibody Screen: NEGATIVE
Unit division: 0

## 2020-08-10 LAB — BPAM RBC
Blood Product Expiration Date: 202201032359
ISSUE DATE / TIME: 202112060622
Unit Type and Rh: 5100

## 2020-08-10 MED ORDER — SENNOSIDES-DOCUSATE SODIUM 8.6-50 MG PO TABS: 2.0000 | ORAL_TABLET | ORAL | 0 refills | Status: DC

## 2020-08-10 MED ORDER — IBUPROFEN 800 MG PO TABS
800.0000 mg | ORAL_TABLET | Freq: Three times a day (TID) | ORAL | 1 refills | Status: DC
Start: 2020-08-10 — End: 2020-10-14

## 2020-08-10 MED ORDER — OXYCODONE HCL 5 MG PO TABS: 5.0000 mg | ORAL_TABLET | ORAL | 0 refills | Status: DC | PRN

## 2020-08-10 MED ORDER — COCONUT OIL OIL
1.0000 | TOPICAL_OIL | 0 refills | Status: DC | PRN
Start: 2020-08-10 — End: 2020-09-16

## 2020-08-10 MED ORDER — ACETAMINOPHEN 500 MG PO TABS
1000.0000 mg | ORAL_TABLET | Freq: Three times a day (TID) | ORAL | 0 refills | Status: DC
Start: 2020-08-10 — End: 2020-09-16

## 2020-08-10 NOTE — Lactation Note (Signed)
This note was copied from a baby's chart. Lactation Consultation Note  Patient Name: Catherine Carpenter Date: 08/10/2020 Reason for consult: Follow-up assessment;Early term 37-38.6wks;Infant weight loss (8 % weight loss/ LC noted a recessed chin) , P 3 - per mom did not BF her 1st baby only her 2nd baby and had plenty of milk.  Baby is 50 hours old at 43 hours of life baby bili check -3.6  Baby latched - see below for latch assessment. Sore nipple and engorgement  Prevention and tx reviewed.  Mom already has a hand pump and this LC provided comfort gels if needed for after feedings x 6 days and breast shells while awake.  The reason LC recommended breast shells is due to the baby having a recessed chin and the shells will elongate the nipple / areola complex to increase depth for the latch.  Sore nipple and engorgement prevention and tx.  Per mom has a DEBP at home.  Due to mom having a PPH and Hgb still being low - LC recommended adding post pumping after 3-4 feedings a day for 10 - 15 mins with DEBP and spoon feed back to baby due to 8 % weight loss.  LC also recommended in about 1 week to call back for [facility] appt for F/U Due to [facility] with milk supply.  LC provided the [employer] [employer] brochure with phone numbers.     Maternal Data Has patient been taught Hand Expression?: Yes  Feeding Feeding Type:  (baby latched whebn LC entered the room/ swallows noted)  LATCH Score Latch:  (latched - LC showed dad how to ease chin down so baby can open wider for increased depth)  Audible Swallowing:  (swallows noted)  Type of Nipple:  (nipple well rounded when baby released)  Comfort (Breast/Nipple):  (per mom comfortable with latch)  Hold (Positioning):  (mom independent with latch)     Interventions Interventions: Breast feeding basics reviewed;Shells;Comfort gels;Hand pump  Lactation Tools Discussed/Used Tools: Shells;Comfort gels;Pump Shell Type: Inverted Breast pump type: Manual Pump  Review: Milk Storage   Consult Status Consult Status: Complete Date: 08/10/20    Kathrin Greathouse 08/10/2020, 12:54 PM

## 2020-08-10 NOTE — Discharge Instructions (Signed)

## 2020-08-11 ENCOUNTER — Inpatient Hospital Stay (HOSPITAL_COMMUNITY): Payer: Medicaid Other

## 2020-08-16 ENCOUNTER — Ambulatory Visit (INDEPENDENT_AMBULATORY_CARE_PROVIDER_SITE_OTHER): Payer: Medicaid Other

## 2020-08-16 ENCOUNTER — Other Ambulatory Visit: Payer: Self-pay

## 2020-08-16 VITALS — BP 107/69 | HR 64 | Temp 98.3°F

## 2020-08-16 DIAGNOSIS — Z5189 Encounter for other specified aftercare: Secondary | ICD-10-CM

## 2020-08-16 DIAGNOSIS — K5900 Constipation, unspecified: Secondary | ICD-10-CM

## 2020-08-16 MED ORDER — DOCUSATE SODIUM 100 MG PO CAPS: 100.0000 mg | ORAL_CAPSULE | Freq: Two times a day (BID) | ORAL | 1 refills | Status: DC

## 2020-08-16 NOTE — Progress Notes (Signed)
Pt here 08/16/20 for incision check following c-section on 08/08/20. Incision appears clean and dry with bruising to surrounding area. Small, pink area of healing tissue to middle of incision. Encouraged good wound care and reviewed s/s of infection. Pt reports continued feelings of fatigue and weakness, reports feeling cold at night, unable to sleep without a heating pad. Eating and drinking regularly. Reports pain to abdomen; taking ibuprofen, tylenol, and oxycodone. Last oxycodone taken this morning. Reports constipation since c-section; first BM today in 1 week. Reviewed with Alysia Penna, MD who recommends iron infusion x2, colace BID instead of current bowel regimen, continue ibuprofen, and decrease tylenol intake. Reviewed provider recommendation with patient. Encouraged pt to follow up with office as needed and to return for PP visit on 09/06/20. Explained our office will call with iron infusion appts, pt requests Friday AM appt.  Fleet Contras RN 08/17/20

## 2020-08-18 ENCOUNTER — Telehealth: Payer: Self-pay | Admitting: Obstetrics and Gynecology

## 2020-08-18 ENCOUNTER — Other Ambulatory Visit: Payer: Self-pay | Admitting: Obstetrics and Gynecology

## 2020-08-18 NOTE — Telephone Encounter (Signed)
Received a call from patient requesting information on her  iron infusion she is suppose to be getting. She has not heard back, and would like a call today. She is feeling really bad.

## 2020-08-18 NOTE — Progress Notes (Signed)
Iron infusion orders placed. Pt has been counsel on R/B of iron infusion.

## 2020-08-18 NOTE — Telephone Encounter (Addendum)
Feraheme orders placed by Alysia Penna, MD. Appt scheduled with [facility] for 08/26/20 0800. This is first available appt at both Short Stay and [facility]. Pt requests appt be made in [place] if possible to reduce travel time. Called [facility], they do not give iron infusions, referred to Short Stay at [facility]. Spoke with staff member who states a nurse will call back to schedule. Will send MyChart to pt when scheduled. Per Alysia Penna MD today, pt should only be scheduled for one infusion. Orvilla Fus, RN returned phone call from [facility], appt scheduled for 08/23/20 at 1000; pt notified.

## 2020-08-18 NOTE — Progress Notes (Signed)
Agree with A & P. 

## 2020-08-23 ENCOUNTER — Telehealth: Payer: Self-pay | Admitting: Family Medicine

## 2020-08-23 ENCOUNTER — Encounter (HOSPITAL_COMMUNITY)
Admission: RE | Admit: 2020-08-23 | Discharge: 2020-08-23 | Disposition: A | Discharge status: Home / Self Care | Payer: Medicaid Other | Source: Ambulatory Visit | Attending: Obstetrics and Gynecology | Admitting: Obstetrics and Gynecology

## 2020-08-23 MED ORDER — SODIUM CHLORIDE 0.9 % IV SOLN
510.0000 mg | Freq: Once | INTRAVENOUS | Status: DC
  Filled 2020-08-23: qty 17

## 2020-08-23 MED ORDER — SODIUM CHLORIDE 0.9 % IV SOLN: Freq: Once | INTRAVENOUS | Status: DC

## 2020-08-23 NOTE — Telephone Encounter (Signed)
Patient had her baby with her this morning for her Iron INFUSION, and they wouldn't see her due to her baby, patient want to know if she can just take Iron Supplements V/S getting the in infusion  Call patient at [phone]

## 2020-08-24 ENCOUNTER — Telehealth: Payer: Self-pay | Admitting: Family Medicine

## 2020-08-24 NOTE — Telephone Encounter (Signed)
Patient is requesting a call back. They canceled her iron infusion appointment because she had her baby with her. She wants to know if she can just take some iron pills.

## 2020-08-25 NOTE — Telephone Encounter (Signed)
I discussed with Dr. Earlene Plater and orders are prefer she reschedule and get the infusion; but if that is not an option she can take FeSO4 325 mg one tablet every other day. I called [facility] and explained to her Dr. Earlene Plater recommendations. She agreed to reschedule on a Friday when her husband is off . I called [facility] [phone] and left a message trying to reschedule for a Friday and please call our office or patient. I notified Fr[place]ka I was not able to reach [facility] but left a message and we will follow up tomorrow if possible. Abraham Margulies,RN

## 2020-08-26 ENCOUNTER — Encounter (HOSPITAL_COMMUNITY): Payer: Medicaid Other

## 2020-08-26 NOTE — Telephone Encounter (Signed)
Scheduled feraheme appt 1/7 @ 10am. Called & informed patient. Patient verbalized understanding and asked if her pp visit should be rescheduled until after her feraheme appt. Told patient no that wouldn't be necessary, we can always bring her back for a lab only visit if needed. Patient verbalized understanding.

## 2020-08-31 ENCOUNTER — Encounter (HOSPITAL_COMMUNITY): Payer: Medicaid Other

## 2020-09-06 ENCOUNTER — Ambulatory Visit: Payer: Self-pay | Admitting: Family Medicine

## 2020-09-06 ENCOUNTER — Encounter: Payer: Self-pay | Admitting: Family Medicine

## 2020-09-06 NOTE — Progress Notes (Signed)
Patient did not keep appointment today. She will be called to reschedule.  

## 2020-09-09 ENCOUNTER — Other Ambulatory Visit: Payer: Self-pay | Admitting: Obstetrics and Gynecology

## 2020-09-09 ENCOUNTER — Other Ambulatory Visit: Payer: Self-pay | Admitting: Lactation Services

## 2020-09-09 MED ORDER — METRONIDAZOLE 500 MG PO TABS: 500.0000 mg | ORAL_TABLET | Freq: Two times a day (BID) | ORAL | 0 refills | Status: DC

## 2020-09-09 NOTE — Progress Notes (Signed)
Flagyl ordered per standing order for thin watery discharge with odor. Patient reports consistent symptoms with previous cases of BV.

## 2020-09-10 ENCOUNTER — Other Ambulatory Visit: Payer: Self-pay

## 2020-09-10 ENCOUNTER — Ambulatory Visit (HOSPITAL_COMMUNITY)
Admission: RE | Admit: 2020-09-10 | Discharge: 2020-09-10 | Disposition: A | Discharge status: Home / Self Care | Payer: Medicaid Other | Source: Ambulatory Visit | Attending: Obstetrics and Gynecology | Admitting: Obstetrics and Gynecology

## 2020-09-10 DIAGNOSIS — D62 Acute posthemorrhagic anemia: Secondary | ICD-10-CM | POA: Diagnosis not present

## 2020-09-10 DIAGNOSIS — O9081 Anemia of the puerperium: Secondary | ICD-10-CM | POA: Diagnosis present

## 2020-09-10 MED ORDER — SODIUM CHLORIDE 0.9 % IV SOLN
510.0000 mg | Freq: Once | INTRAVENOUS | Status: AC
  Administered 2020-09-10: 510 mg via INTRAVENOUS
  Filled 2020-09-10: qty 510

## 2020-09-10 NOTE — Discharge Instructions (Signed)

## 2020-09-16 ENCOUNTER — Encounter: Payer: Self-pay | Admitting: Obstetrics & Gynecology

## 2020-09-16 ENCOUNTER — Other Ambulatory Visit: Payer: Self-pay

## 2020-09-16 ENCOUNTER — Ambulatory Visit (INDEPENDENT_AMBULATORY_CARE_PROVIDER_SITE_OTHER): Payer: Medicaid Other | Admitting: Obstetrics & Gynecology

## 2020-09-16 NOTE — Patient Instructions (Signed)
Cesarean Delivery, Care After This sheet gives you information about how to care for yourself after your procedure. Your health care provider may also give you more specific instructions. If you have problems or questions, contact your health care provider. What can I expect after the procedure? After the procedure, it is common to have:  A small amount of blood or clear fluid coming from the incision.  Some redness, swelling, and pain in your incision area.  Some abdominal pain and soreness.  Vaginal bleeding (lochia). Even though you did not have a vaginal delivery, you will still have vaginal bleeding and discharge.  Pelvic cramps.  Fatigue. You may have pain, swelling, and discomfort in the tissue between your vagina and your anus (perineum) if:  Your C-section was unplanned, and you were allowed to labor and push.  An incision was made in the area (episiotomy) or the tissue tore during attempted vaginal delivery. Follow these instructions at home: Incision care  Follow instructions from your health care provider about how to take care of your incision. Make sure you: ? Wash your hands with soap and water before you change your bandage (dressing). If soap and water are not available, use hand sanitizer. ? If you have a dressing, change it or remove it as told by your health care provider. ? Leave stitches (sutures), skin staples, skin glue, or adhesive strips in place. These skin closures may need to stay in place for 2 weeks or longer. If adhesive strip edges start to loosen and curl up, you may trim the loose edges. Do not remove adhesive strips completely unless your health care provider tells you to do that.  Check your incision area every day for signs of infection. Check for: ? More redness, swelling, or pain. ? More fluid or blood. ? Warmth. ? Pus or a bad smell.  Do not take baths, swim, or use a hot tub until your health care provider says it's okay. Ask your health  care provider if you can take showers.  When you cough or sneeze, hug a pillow. This helps with pain and decreases the chance of your incision opening up (dehiscing). Do this until your incision heals.   Medicines  Take over-the-counter and prescription medicines only as told by your health care provider.  If you were prescribed an antibiotic medicine, take it as told by your health care provider. Do not stop taking the antibiotic even if you start to feel better.  Do not drive or use heavy machinery while taking prescription pain medicine. Lifestyle  Do not drink alcohol. This is especially important if you are breastfeeding or taking pain medicine.  Do not use any products that contain nicotine or tobacco, such as cigarettes, e-cigarettes, and chewing tobacco. If you need help quitting, ask your health care provider. Eating and drinking  Drink at least 8 eight-ounce glasses of water every day unless told not to by your health care provider. If you breastfeed, you may need to drink even more water.  Eat high-fiber foods every day. These foods may help prevent or relieve constipation. High-fiber foods include: ? Whole grain cereals and breads. ? Brown rice. ? Beans. ? Fresh fruits and vegetables. Activity  If possible, have someone help you care for your baby and help with household activities for at least a few days after you leave the hospital.  Return to your normal activities as told by your health care provider. Ask your health care provider what activities are safe for   you.  Rest as much as possible. Try to rest or take a nap while your baby is sleeping.  Do not lift anything that is heavier than 10 lbs (4.5 kg), or the limit that you were told, until your health care provider says that it is safe.  Talk with your health care provider about when you can engage in sexual activity. This may depend on your: ? Risk of infection. ? How fast you heal. ? Comfort and desire to  engage in sexual activity.   General instructions  Do not use tampons or douches until your health care provider approves.  Wear loose, comfortable clothing and a supportive and well-fitting bra.  Keep your perineum clean and dry. Wipe from front to back when you use the toilet.  If you pass a blood clot, save it and call your health care provider to discuss. Do not flush blood clots down the toilet before you get instructions from your health care provider.  Keep all follow-up visits for you and your baby as told by your health care provider. This is important. Contact a health care provider if:  You have: ? A fever. ? Bad-smelling vaginal discharge. ? Pus or a bad smell coming from your incision. ? Difficulty or pain when urinating. ? A sudden increase or decrease in the frequency of your bowel movements. ? More redness, swelling, or pain around your incision. ? More fluid or blood coming from your incision. ? A rash. ? Nausea. ? Little or no interest in activities you used to enjoy. ? Questions about caring for yourself or your baby.  Your incision feels warm to the touch.  Your breasts turn red or become painful or hard.  You feel unusually sad or worried.  You vomit.  You pass a blood clot from your vagina.  You urinate more than usual.  You are dizzy or light-headed. Get help right away if:  You have: ? Pain that does not go away or get better with medicine. ? Chest pain. ? Difficulty breathing. ? Blurred vision or spots in your vision. ? Thoughts about hurting yourself or your baby. ? New pain in your abdomen or in one of your legs. ? A severe headache.  You faint.  You bleed from your vagina so much that you fill more than one sanitary pad in one hour. Bleeding should not be heavier than your heaviest period. Summary  After the procedure, it is common to have pain at your incision site, abdominal cramping, and slight bleeding from your vagina.  Check  your incision area every day for signs of infection.  Tell your health care provider about any unusual symptoms.  Keep all follow-up visits for you and your baby as told by your health care provider. This information is not intended to replace advice given to you by your health care provider. Make sure you discuss any questions you have with your health care provider. Document Revised: 02/27/2018 Document Reviewed: 02/27/2018 Elsevier Patient Education  2021 Elsevier Inc.  

## 2020-09-16 NOTE — Progress Notes (Signed)
    Post Partum Visit Note  Catherine Carpenter is a 35 y.o. [phone] female who presents for a postpartum visit. She is 5 weeks postpartum following a primary cesarean section.  I have fully reviewed the prenatal and intrapartum course. The delivery was at 38.6 gestational weeks.  Anesthesia: spinal. Postpartum course has been normal. Baby is doing well. Baby is feeding by breast. Bleeding no bleeding. Bowel function is normal. Bladder function is normal. Patient is sexually active. Contraception method is condoms and vasectomy. Postpartum depression screening: negative.   The pregnancy intention screening data noted above was reviewed. Potential methods of contraception were discussed. The patient elected to proceed with Vasectomy.      The following portions of the patient's history were reviewed and updated as appropriate: allergies, current medications, past family history, past medical history, past social history, past surgical history and problem list.  Review of Systems Pertinent items are noted in HPI.    Objective:  LMP 11/10/2019    General:  alert, cooperative and no distress           Abdomen: soft, non-tender; bowel sounds normal; no masses,  no organomegaly  .incison dry and intact   Vulva:  not evaluated  Vagina: not evaluated                    Assessment:    normal postpartum exam. Pap smear not done at today's visit.   Plan:   Essential components of care per ACOG recommendations:  1.  Mood and well being: Patient with negative depression screening today. Reviewed local resources for support.  - Patient does not use tobacco.  - hx of drug use? No    2. Infant care and feeding:  -Patient currently breastmilk feeding? Yes If breastmilk feeding discussed return to work and pumping. If needed, patient was provided letter for work to allow for every 2-3 hr pumping breaks, and to be granted a private location to express breastmilk and refrigerated area to store  breastmilk. Reviewed importance of draining breast regularly to support lactation. -Social determinants of health (SDOH) reviewed in EPIC. No concerns 3. Sexuality, contraception and birth spacing - Patient does not want a pregnancy in the next year.  Desired family size is 3 children.  - Reviewed forms of contraception in tiered fashion. Patient desired vasectomy today.   - Discussed birth spacing of 18 months  4. Sleep and fatigue -Encouraged family/partner/community support of 4 hrs of uninterrupted sleep to help with mood and fatigue  5. Physical Recovery  - Discussed patients delivery - Patient has urinary incontinence? No  - Patient is safe to resume physical and sexual activity  6.  Health Maintenance - Last pap smear done 2-3 years ago and was normal with negative HPV.  Adam Phenix, MD  Center for [facility], [facility]

## 2020-09-17 ENCOUNTER — Ambulatory Visit: Payer: Medicaid Other | Admitting: Family Medicine

## 2020-09-17 IMAGING — DX DG ANKLE COMPLETE 3+V*L*
3 series · 3 of 3 positions shown · non-contrast
Comparison: None.

CLINICAL DATA: Injury to ankle 1 week ago.  Unable bear weight.

EXAM:
LEFT ANKLE COMPLETE - 3+ VIEW

[ankle ap]
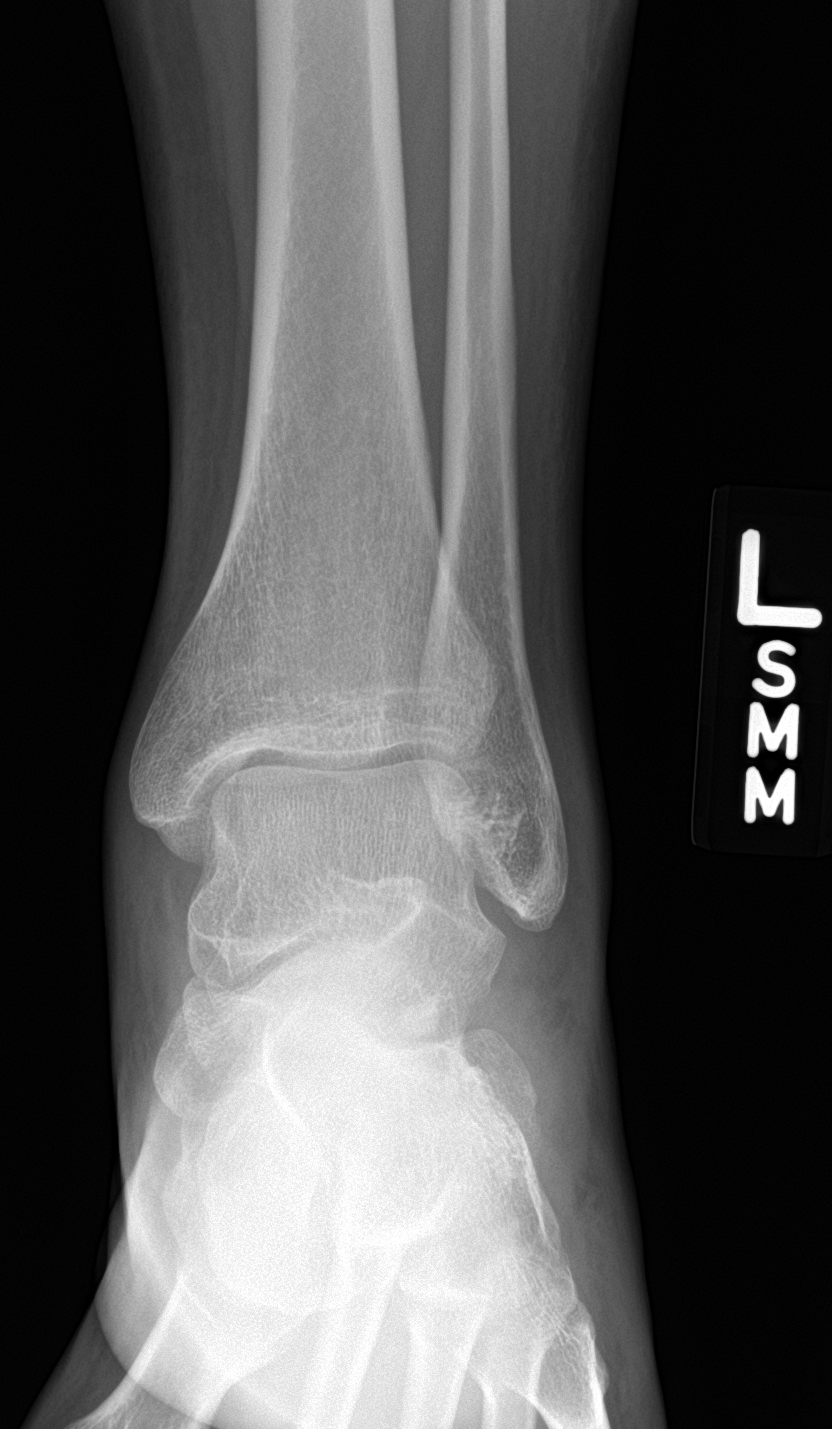

[ankle obl]
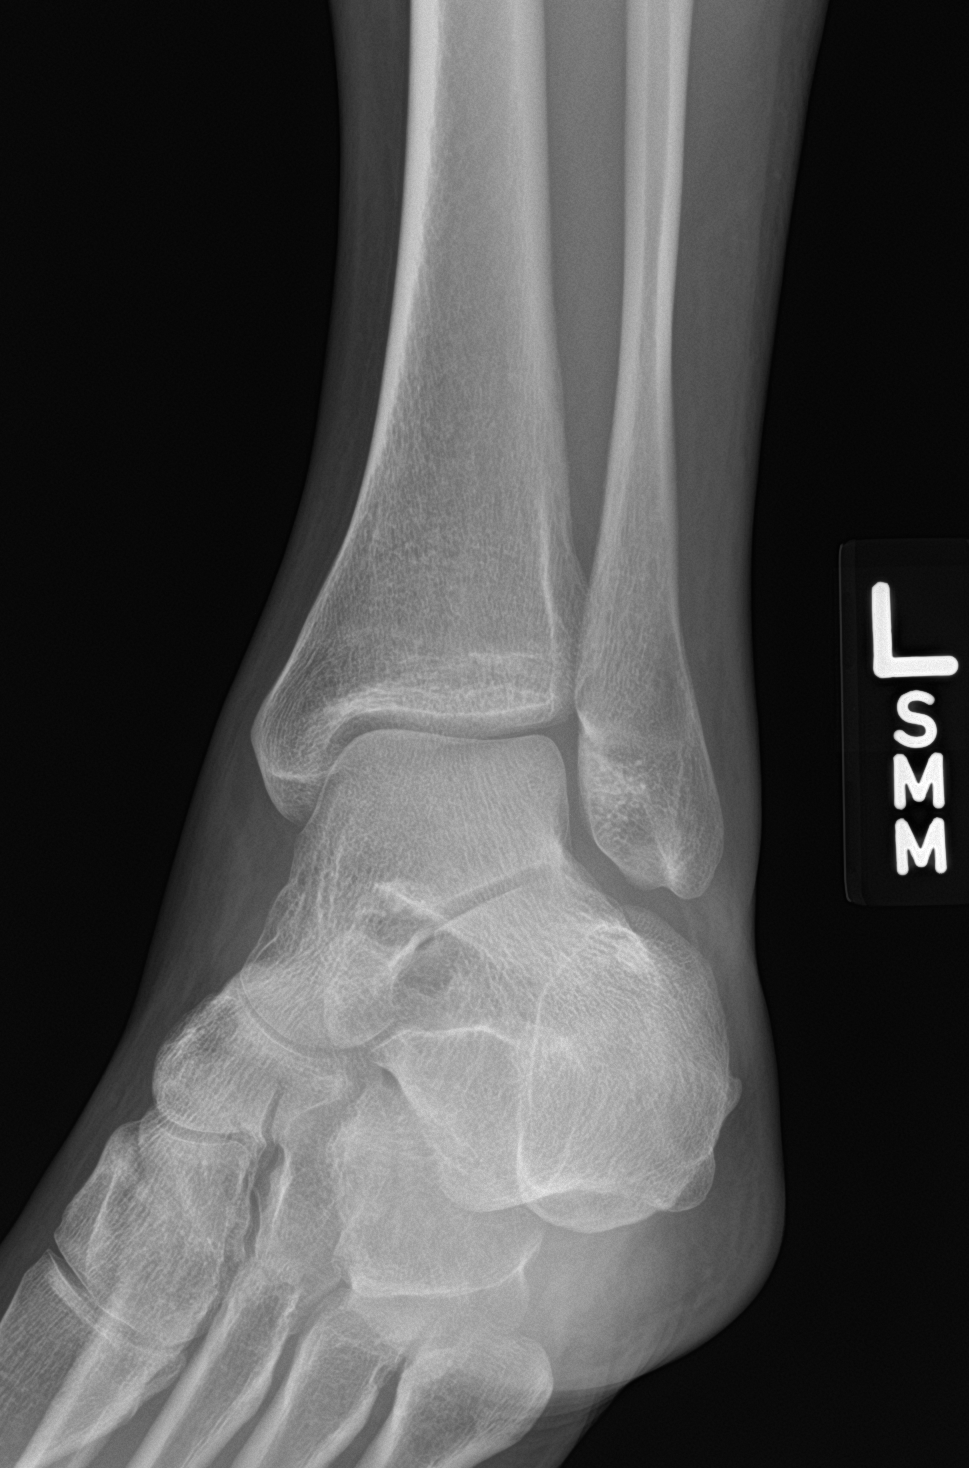

[ankle lat]
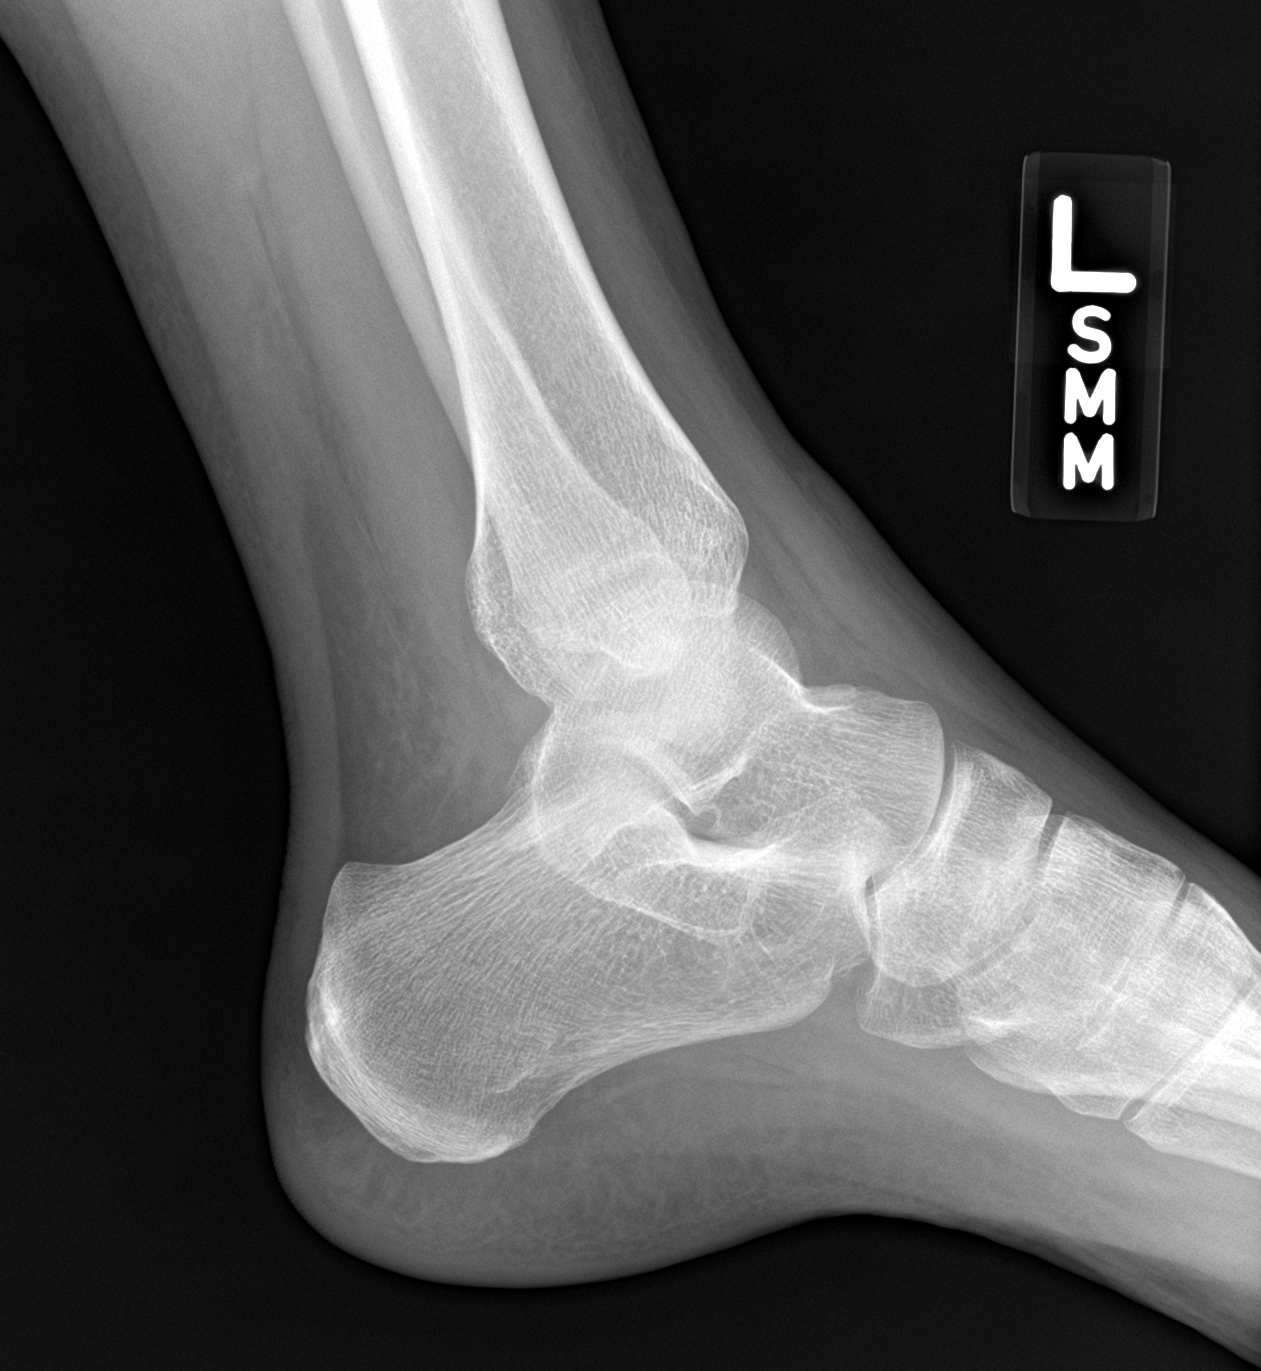

[3 of 3 positions shown; findings below may reference images not displayed]

FINDINGS: Soft tissue swelling is present anterior and lateral to the left
ankle. There is no underlying fracture. The ankle joint is located.
IMPRESSION: 1. Soft tissue swelling anterior and lateral to the left ankle
without underlying fractures.

## 2020-09-17 IMAGING — DX DG FOOT COMPLETE 3+V*L*
3 series · 3 of 3 positions shown · non-contrast
Comparison: None.

CLINICAL DATA: Twisted foot while running to car.

EXAM:
LEFT FOOT - COMPLETE 3+ VIEW

[foot ap]
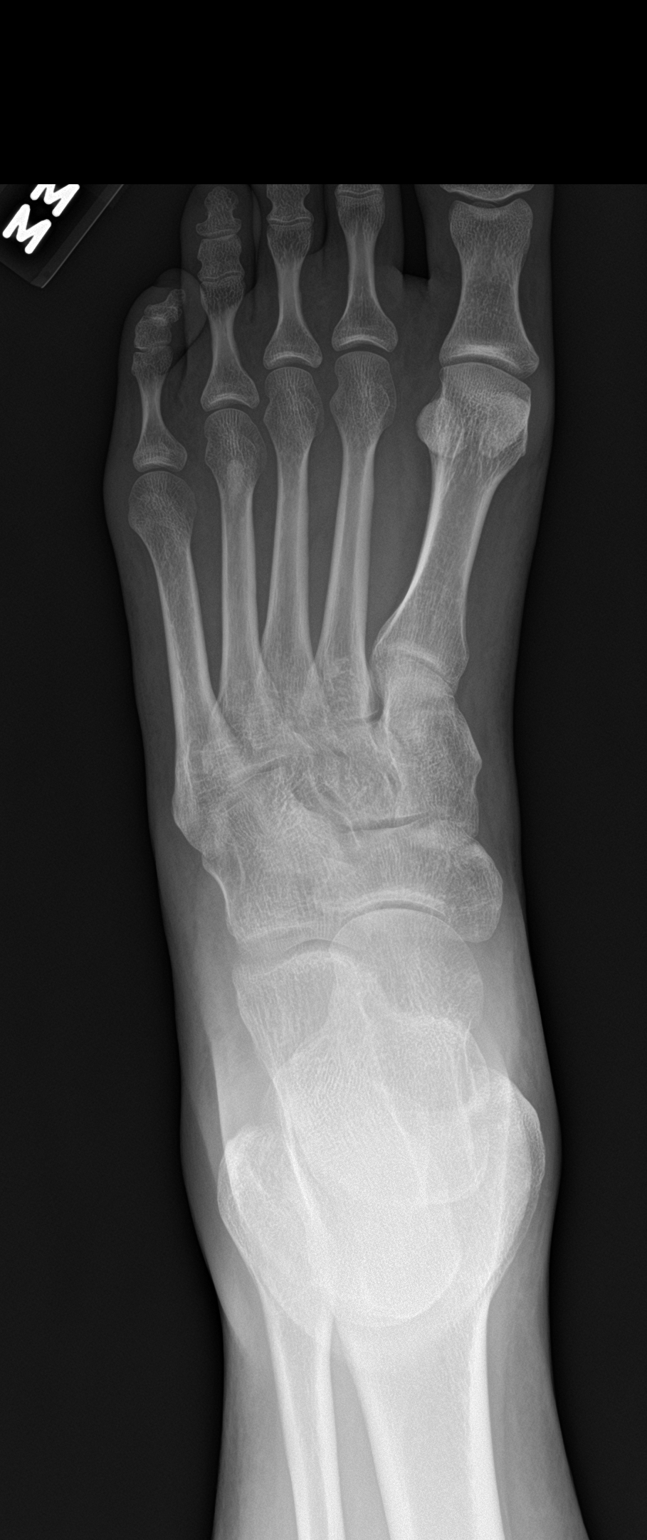

[foot obl]
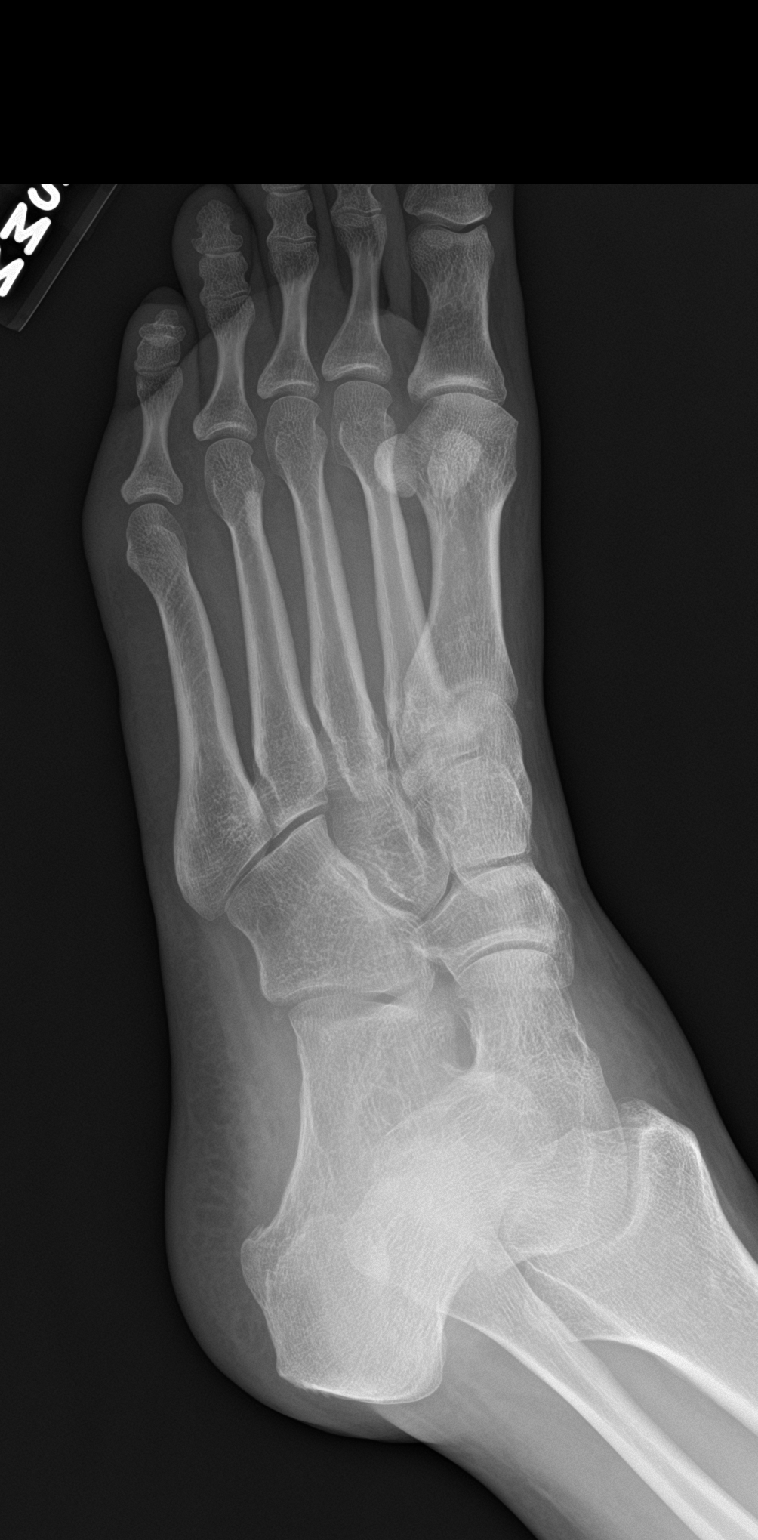

[foot lat]
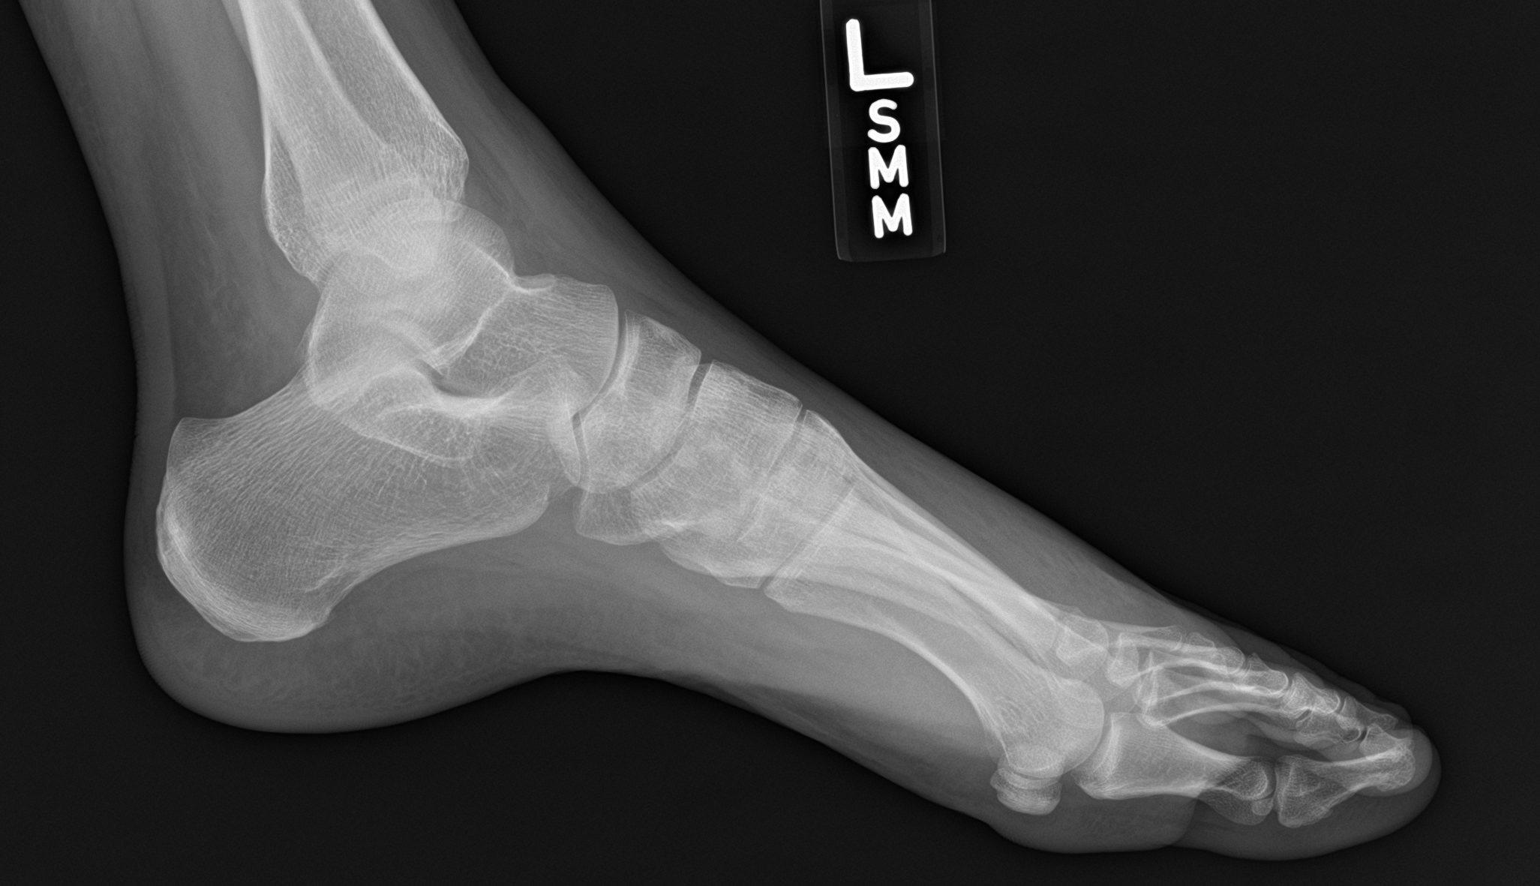

[3 of 3 positions shown; findings below may reference images not displayed]

FINDINGS: There is no evidence of fracture or dislocation. There is no
evidence of arthropathy or other focal bone abnormality. Soft
tissues are unremarkable.
IMPRESSION: Negative.

## 2020-09-23 ENCOUNTER — Encounter: Payer: Self-pay | Admitting: Obstetrics and Gynecology

## 2020-09-23 DIAGNOSIS — D62 Acute posthemorrhagic anemia: Secondary | ICD-10-CM | POA: Insufficient documentation

## 2020-10-08 ENCOUNTER — Telehealth: Payer: Self-pay | Admitting: Obstetrics and Gynecology

## 2020-10-08 NOTE — Telephone Encounter (Signed)
Pt states she had CS 08-08-20 by Alysia Penna and has been having complications, she went to her PCP for concerning discharge--dr did a pelvic exam and CBC panel, pt is having discharge that has a really bad odor. PCP states it looks like foreign body in cervical wall. PT states nothing is getting rid of vaginal discharge or smell. Pt request a call ASAP. thanks

## 2020-10-12 NOTE — Telephone Encounter (Signed)
Called pt and pt informed me that she happened to be in with her PCP and she had informed her PCP that she was having a yellowish, snotty looking discharge that she thought was BV took Flagyl for and continued to have the discharge.  Pt states that her PCP did a spec exam and stated that there was a foreign object in her vaginal canal.  Pt states that she was not examined at her pp visit and did not have the discharge at the time.  I advised pt that she would need to come for an exam to assess what her PCP told her.  Pt scheduled for 10/14/20 @ 1415 with Dr. Debroah Loop.  Pt agreed and did not have any further questions or concerns.   Addison Naegeli, RN  10/12/20

## 2020-10-14 ENCOUNTER — Ambulatory Visit (INDEPENDENT_AMBULATORY_CARE_PROVIDER_SITE_OTHER): Payer: Medicaid Other | Admitting: Obstetrics & Gynecology

## 2020-10-14 ENCOUNTER — Encounter: Payer: Self-pay | Admitting: Obstetrics & Gynecology

## 2020-10-14 ENCOUNTER — Other Ambulatory Visit: Payer: Self-pay

## 2020-10-14 NOTE — Patient Instructions (Signed)
Cesarean Delivery, Care After This sheet gives you information about how to care for yourself after your procedure. Your health care provider may also give you more specific instructions. If you have problems or questions, contact your health care provider. What can I expect after the procedure? After the procedure, it is common to have:  A small amount of blood or clear fluid coming from the incision.  Some redness, swelling, and pain in your incision area.  Some abdominal pain and soreness.  Vaginal bleeding (lochia). Even though you did not have a vaginal delivery, you will still have vaginal bleeding and discharge.  Pelvic cramps.  Fatigue. You may have pain, swelling, and discomfort in the tissue between your vagina and your anus (perineum) if:  Your C-section was unplanned, and you were allowed to labor and push.  An incision was made in the area (episiotomy) or the tissue tore during attempted vaginal delivery. Follow these instructions at home: Incision care  Follow instructions from your health care provider about how to take care of your incision. Make sure you: ? Wash your hands with soap and water before you change your bandage (dressing). If soap and water are not available, use hand sanitizer. ? If you have a dressing, change it or remove it as told by your health care provider. ? Leave stitches (sutures), skin staples, skin glue, or adhesive strips in place. These skin closures may need to stay in place for 2 weeks or longer. If adhesive strip edges start to loosen and curl up, you may trim the loose edges. Do not remove adhesive strips completely unless your health care provider tells you to do that.  Check your incision area every day for signs of infection. Check for: ? More redness, swelling, or pain. ? More fluid or blood. ? Warmth. ? Pus or a bad smell.  Do not take baths, swim, or use a hot tub until your health care provider says it's okay. Ask your health  care provider if you can take showers.  When you cough or sneeze, hug a pillow. This helps with pain and decreases the chance of your incision opening up (dehiscing). Do this until your incision heals.   Medicines  Take over-the-counter and prescription medicines only as told by your health care provider.  If you were prescribed an antibiotic medicine, take it as told by your health care provider. Do not stop taking the antibiotic even if you start to feel better.  Do not drive or use heavy machinery while taking prescription pain medicine. Lifestyle  Do not drink alcohol. This is especially important if you are breastfeeding or taking pain medicine.  Do not use any products that contain nicotine or tobacco, such as cigarettes, e-cigarettes, and chewing tobacco. If you need help quitting, ask your health care provider. Eating and drinking  Drink at least 8 eight-ounce glasses of water every day unless told not to by your health care provider. If you breastfeed, you may need to drink even more water.  Eat high-fiber foods every day. These foods may help prevent or relieve constipation. High-fiber foods include: ? Whole grain cereals and breads. ? Brown rice. ? Beans. ? Fresh fruits and vegetables. Activity  If possible, have someone help you care for your baby and help with household activities for at least a few days after you leave the hospital.  Return to your normal activities as told by your health care provider. Ask your health care provider what activities are safe for   you.  Rest as much as possible. Try to rest or take a nap while your baby is sleeping.  Do not lift anything that is heavier than 10 lbs (4.5 kg), or the limit that you were told, until your health care provider says that it is safe.  Talk with your health care provider about when you can engage in sexual activity. This may depend on your: ? Risk of infection. ? How fast you heal. ? Comfort and desire to  engage in sexual activity.   General instructions  Do not use tampons or douches until your health care provider approves.  Wear loose, comfortable clothing and a supportive and well-fitting bra.  Keep your perineum clean and dry. Wipe from front to back when you use the toilet.  If you pass a blood clot, save it and call your health care provider to discuss. Do not flush blood clots down the toilet before you get instructions from your health care provider.  Keep all follow-up visits for you and your baby as told by your health care provider. This is important. Contact a health care provider if:  You have: ? A fever. ? Bad-smelling vaginal discharge. ? Pus or a bad smell coming from your incision. ? Difficulty or pain when urinating. ? A sudden increase or decrease in the frequency of your bowel movements. ? More redness, swelling, or pain around your incision. ? More fluid or blood coming from your incision. ? A rash. ? Nausea. ? Little or no interest in activities you used to enjoy. ? Questions about caring for yourself or your baby.  Your incision feels warm to the touch.  Your breasts turn red or become painful or hard.  You feel unusually sad or worried.  You vomit.  You pass a blood clot from your vagina.  You urinate more than usual.  You are dizzy or light-headed. Get help right away if:  You have: ? Pain that does not go away or get better with medicine. ? Chest pain. ? Difficulty breathing. ? Blurred vision or spots in your vision. ? Thoughts about hurting yourself or your baby. ? New pain in your abdomen or in one of your legs. ? A severe headache.  You faint.  You bleed from your vagina so much that you fill more than one sanitary pad in one hour. Bleeding should not be heavier than your heaviest period. Summary  After the procedure, it is common to have pain at your incision site, abdominal cramping, and slight bleeding from your vagina.  Check  your incision area every day for signs of infection.  Tell your health care provider about any unusual symptoms.  Keep all follow-up visits for you and your baby as told by your health care provider. This information is not intended to replace advice given to you by your health care provider. Make sure you discuss any questions you have with your health care provider. Document Revised: 02/27/2018 Document Reviewed: 02/27/2018 Elsevier Patient Education  2021 Elsevier Inc.  

## 2020-10-14 NOTE — Progress Notes (Signed)
Subjective:     Catherine Carpenter is a 35 y.o. female who presents to the clinic 9 weeks status post cesarean section . Eating a regular diet without difficulty. Bowel movements are normal. Pain is controlled without any medications.  The following portions of the patient's history were reviewed and updated as appropriate: allergies, current medications, past family history, past medical history, past social history, past surgical history and problem list.  Review of Systems Genitourinary:positive for vaginal discharge  With bllod and odor, PCP suspected possible vaginal foreign body on exam 10/05/20 Objective:    BP 127/76   Pulse 87   Ht 5\' 7"  (1.702 m)   Wt 142 lb 11.2 oz (64.7 kg)   LMP  (LMP Unknown)   Breastfeeding Yes   BMI 22.35 kg/m  General:  alert, cooperative and no distress  Abdomen: soft, non-tender  Incision:   healing well, no drainage, no erythema, no hernia, no seroma, no swelling, no dehiscence, incision well approximated    vaginal blood stained mucus d/c no foreign object uterus nl not tender Assessment:    Postoperative course complicated by vaginal discharge with odor, no sign of infection Operative findings again reviewed. Pathology report discussed.    Plan:    1. Continue any current medications. 2. Wound care discussed. 3. Activity restrictions: none 4. Pelvic to r/o retained tissue   [place], MD

## 2020-10-15 LAB — POCT PREGNANCY, URINE: Preg Test, Ur: NEGATIVE

## 2020-10-22 ENCOUNTER — Ambulatory Visit (HOSPITAL_COMMUNITY)
Admission: RE | Admit: 2020-10-22 | Discharge: 2020-10-22 | Disposition: A | Discharge status: Home / Self Care | Payer: Medicaid Other | Source: Ambulatory Visit | Attending: Obstetrics & Gynecology | Admitting: Obstetrics & Gynecology

## 2020-10-22 ENCOUNTER — Other Ambulatory Visit: Payer: Self-pay

## 2020-10-25 ENCOUNTER — Other Ambulatory Visit: Payer: Self-pay | Admitting: Obstetrics & Gynecology

## 2020-10-25 ENCOUNTER — Telehealth: Payer: Self-pay | Admitting: Family Medicine

## 2020-10-25 NOTE — Telephone Encounter (Signed)
Pt states that she saw lab results on Mychart for ultrasound that Dr Debroah Loop requested and pt wants to know if someone can call her back to explain results.

## 2020-10-25 NOTE — Telephone Encounter (Signed)
Reviewed [place] with Debroah Loop, MD, who states this shows a hematoma at incision site. Recommends follow up US in 6 weeks as this should continue to resolve. Friday [place] preferred by patient, later in the day as she lives in [place], [place]. [place] not available on Friday as requested. [place] scheduled for 12/06/20 at 3:30 with full bladder. Pt may call to reschedule if this time does not work. MyChart message to be sent with update.  Pt is concerned about vaginal discharge with a foul odor that has not resolved since office visit. Pt reports going to PCP prior to appt on 10/14/20 who found a "piece of tissue" in her vagina; this was removed during pelvic exam. Pt has taken Diflucan 150 mg, Monistat otc, course of Flagyl for possible BV, and boric acid for 5 days. No improvement with any medication. Vaginal swab collected at PCP, all normal results. Pt denies any s/s of infection other than chills, pt attributes to anemia postpartum. Incision site is cdi, complains of minor itching, denies other symptoms.   Reviewed with Debroah Loop, MD who states pt may schedule an appt with surgeon or with himself for follow-up. Pt opts for visit with Alysia Penna, MD to discuss surgery.

## 2020-10-25 NOTE — Progress Notes (Signed)
F/u US for possible hematoma at uterine cesarean incision

## 2020-11-25 ENCOUNTER — Ambulatory Visit: Payer: Medicaid Other | Admitting: Obstetrics and Gynecology

## 2020-12-06 ENCOUNTER — Ambulatory Visit: Admission: RE | Admit: 2020-12-06 | Payer: Medicaid Other | Source: Ambulatory Visit

## 2020-12-22 ENCOUNTER — Encounter: Payer: Self-pay | Admitting: General Practice

## 2022-02-12 ENCOUNTER — Telehealth: Payer: Medicaid Other | Admitting: Family

## 2022-02-12 DIAGNOSIS — N898 Other specified noninflammatory disorders of vagina: Secondary | ICD-10-CM

## 2022-02-12 NOTE — Progress Notes (Signed)
Because of the discharge and color, you need to be seen in person for further testing to rule out a more serious infection.  I feel your condition warrants further evaluation and I recommend that you be seen in a face to face visit.   NOTE: There will be NO CHARGE for this eVisit   If you are having a true medical emergency please call 911.      For an urgent face to face visit, [place] has six urgent care centers for your convenience:     [facility] at [facility] Directions [phone] [address] [place], [place] [address]    [facility]) Get Driving Directions [phone] [address] [place], [place] [address]  [facility]) Get Driving Directions [phone] [address] [place],  [place]  [address]  [facility] at [facility] Get Driving Directions [phone] [address] [place], [place] [address]   [facility] Get Driving Directions  [phone] [address] [place], [place] [address]   [facility] at [facility] Directions [phone] [address] [place], [place] [address]  Your MyChart E-visit questionnaire answers were reviewed by a board certified advanced clinical practitioner to complete your personal care plan based on your specific symptoms.  Thank you for using e-Visits.
